# Patient Record
Sex: Female | Born: 1965 | Race: Black or African American | Hispanic: No | Smoking: Never smoker
Health system: Southern US, Community
[De-identification: ages and names within clinical notes are randomized; demographics above are authoritative.]

## PROBLEM LIST (undated history)

## (undated) DIAGNOSIS — F419 Anxiety disorder, unspecified: Secondary | ICD-10-CM

## (undated) DIAGNOSIS — D649 Anemia, unspecified: Secondary | ICD-10-CM

## (undated) DIAGNOSIS — K219 Gastro-esophageal reflux disease without esophagitis: Secondary | ICD-10-CM

## (undated) DIAGNOSIS — Z5189 Encounter for other specified aftercare: Secondary | ICD-10-CM

## (undated) DIAGNOSIS — D219 Benign neoplasm of connective and other soft tissue, unspecified: Secondary | ICD-10-CM

## (undated) DIAGNOSIS — J45909 Unspecified asthma, uncomplicated: Secondary | ICD-10-CM

## (undated) HISTORY — PX: EMBOLIZATION: SHX1496

## (undated) HISTORY — PX: CHOLECYSTECTOMY: SHX55

## (undated) HISTORY — DX: Encounter for other specified aftercare: Z51.89

## (undated) HISTORY — PX: MYOMECTOMY: SHX85

## (undated) HISTORY — PX: THYROIDECTOMY, PARTIAL: SHX18

## (undated) HISTORY — PX: OTHER SURGICAL HISTORY: SHX169

---

## 2010-06-09 ENCOUNTER — Emergency Department (HOSPITAL_COMMUNITY): Admission: EM | Admit: 2010-06-09 | Discharge: 2010-06-09 | Payer: Self-pay | Admitting: Emergency Medicine

## 2016-01-15 ENCOUNTER — Other Ambulatory Visit: Payer: Self-pay | Admitting: Obstetrics and Gynecology

## 2016-01-25 NOTE — Patient Instructions (Addendum)
Your procedure is scheduled on:  Monday, February 01, 2016  Enter through the Main Entrance of [facility] at:  12:45 PM  Pick up the phone at the desk and dial [phone].  Call this number if you have problems the morning of surgery: [phone].  Remember:  Do NOT eat food:  After midnight Sunday  Do NOT drink clear liquids after:  9:00 AM day of surgery  Take these medicines the morning of surgery with a SIP OF WATER:  Clonazepam  Bring asthma inhaler day of surgery  Do NOT wear jewelry (body piercing), metal hair clips/bobby pins, make-up, or nail polish. Do NOT wear lotions, powders, or perfumes.  You may wear deoderant. Do NOT shave for 48 hours prior to surgery. Do NOT bring valuables to the hospital. Contacts, dentures, or bridgework may not be worn into surgery.  Leave suitcase in car.  After surgery it may be brought to your room.  For patients admitted to the hospital, checkout time is 11:00 AM the day of discharge.

## 2016-01-26 ENCOUNTER — Encounter (HOSPITAL_COMMUNITY)
Admission: RE | Admit: 2016-01-26 | Discharge: 2016-01-26 | Disposition: A | Discharge status: Home / Self Care | Payer: BC Managed Care – PPO | Source: Ambulatory Visit | Attending: Obstetrics and Gynecology | Admitting: Obstetrics and Gynecology

## 2016-01-26 ENCOUNTER — Encounter (HOSPITAL_COMMUNITY): Payer: Self-pay

## 2016-01-26 DIAGNOSIS — Z01812 Encounter for preprocedural laboratory examination: Secondary | ICD-10-CM | POA: Diagnosis present

## 2016-01-26 HISTORY — DX: Anxiety disorder, unspecified: F41.9

## 2016-01-26 HISTORY — DX: Benign neoplasm of connective and other soft tissue, unspecified: D21.9

## 2016-01-26 HISTORY — DX: Anemia, unspecified: D64.9

## 2016-01-26 HISTORY — DX: Unspecified asthma, uncomplicated: J45.909

## 2016-01-26 HISTORY — DX: Gastro-esophageal reflux disease without esophagitis: K21.9

## 2016-01-26 LAB — CBC
HEMATOCRIT: 33.2 % — AB (ref 36.0–46.0)
HEMOGLOBIN: 10.4 g/dL — AB (ref 12.0–15.0)
MCH: 24.4 pg — AB (ref 26.0–34.0)
MCHC: 31.3 g/dL (ref 30.0–36.0)
MCV: 77.8 fL — AB (ref 78.0–100.0)
Platelets: 365 10*3/uL (ref 150–400)
RBC: 4.27 MIL/uL (ref 3.87–5.11)
RDW: 15.9 % — ABNORMAL HIGH (ref 11.5–15.5)
WBC: 9.1 10*3/uL (ref 4.0–10.5)

## 2016-01-26 LAB — BASIC METABOLIC PANEL
ANION GAP: 6 (ref 5–15)
BUN: 10 mg/dL (ref 6–20)
CO2: 25 mmol/L (ref 22–32)
Calcium: 9.1 mg/dL (ref 8.9–10.3)
Chloride: 106 mmol/L (ref 101–111)
Creatinine, Ser: 0.76 mg/dL (ref 0.44–1.00)
GFR calc Af Amer: >60 mL/min (ref >60)
GFR calc non Af Amer: >60 mL/min (ref >60)
GLUCOSE: 89 mg/dL (ref 65–99)
POTASSIUM: 4.5 mmol/L (ref 3.5–5.1)
Sodium: 137 mmol/L (ref 135–145)

## 2016-01-27 ENCOUNTER — Other Ambulatory Visit (HOSPITAL_COMMUNITY): Payer: Self-pay | Admitting: Obstetrics and Gynecology

## 2016-01-27 NOTE — H&P (Signed)
Marilyn Jensen is a 50 y.o.  female P: 1-0-1-1 who presents for hysterectomy because of menorrhagia, history of severe anemia and symptomatic uterine fibroids.  Since age 28 the patient has suffered heavy periods due to uterine fibroids and over time has had  Lupron injections, a myomectomy and uterine artery embolization with only  transient improvement in her symptoms.  She has undergone blood transfusions and iron injections as a result of blood loss associated with her periods.  Currently has a 7 day monthly flow with pad change every 3 hours with minimal cramping. She denies intermenstrual bleeding nor changes in bowel or bladder function .  Her constipation problems are responsive to Benefiber daily.   An endometrial biopsy in January 2017 returned benign endometrial type polyp with no hyperplasia or malignancy.  A pelvic ultrasound at that same time showed a uterus: 12.11 x 7.56 x 6.91 cm, endometrium-1.68 mm; normal ovaries and #7 uterine fibroids: 5.16 cm, 2.40 cm, 4.10 cm, 3.81 cm, 3.39 cm, 2.14 cm and 2.95 cm.   On exam the patient's uterus measures 16 week size.  A review of both medical and surgical management options were given to the patient however,  she would like definitive therapy in the form of hysterectomy.   Past Medical History  OB History: G: 2  P: 1-0-1-1;  C-section: 1997  GYN History: menarche: 50 YO    LMP: 01/11/16   Contracepton abstinence  The patient denies history of sexually transmitted disease.  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 albuterol (PROVENTIL HFA;VENTOLIN HFA) 108 (90 Base) MCG/ACT inhaler Inhale 1 puff into the lungs every 6 (six) hours as needed for wheezing or shortness of breath.  . calcium carbonate (TUMS - DOSED IN MG ELEMENTAL CALCIUM) 500 MG chewable tablet Chew 1 tablet by mouth daily as needed for indigestion or heartburn.  . clonazePAM (KLONOPIN) 0.5 MG tablet Take 0.5 mg by mouth 2 (two) times daily as needed for anxiety.  Marland Kitchen ibuprofen (ADVIL,MOTRIN) 200 MG tablet Take 200 mg by mouth every 6 (six) hours as needed (pain or headache).   No facility-administered encounter medications on file as of 01/27/2016.   Ferrous Sulfate 325 mg   bid  No Known Allergies   Denies sensitivity to peanuts, shellfish, soy, latex or adhesives.   ROS: Admits to glasses, shortness of breath with asthma flare and occasional stress urinary incontinence symptoms; Denies headache, vision changes, nasal congestion, dysphagia, tinnitus, dizziness, hoarseness, cough,  chest pain,  nausea, vomiting, diarrhea,constipation,  urinary frequency, urgency  dysuria, hematuria, vaginitis symptoms, pelvic pain, swelling of joints,easy bruising,  myalgias, arthralgias, skin rashes, unexplained weight loss and except as is mentioned in the history of present illness, patient's review of systems is otherwise negative.   Physical Exam  Bp:  104/66    P: 84   R: 20  Temperature: 98.3 degrees F orally  Weight:  205.5 lbs.  Height: 5\' 9"   BMI: 30.3  Neck: supple without masses or thyromegaly Lungs: clear to auscultation Heart: regular rate and rhythm Abdomen: firm mass from pelvis to approximately 4 fingerbreadths above the symphysis pubis Pelvic:EGBUS- wnl; vagina-normal rugae; uterus-16 weeks size, cervix without lesions or  motion tenderness; adnexae-no tenderness or masses Extremities:  no clubbing, cyanosis or edema   Assesment: Symptomatic Uterine Fibroids            Menorrhagia    Disposition:  Reviewed with the patient the indications for her procedure(s) along with  the risks of surgery to include, but not limited to: reaction to anesthesia, damage to adjacent organs, infection, excessive bleeding and the possible need for an open abdominal incision. The patient verbalized understanding of these risks and has consented to proceed with an Laparoscopically Assisted Vaginal Hysterectomy with Bilateral Salpingectomy  at [facility] on 02/01/2016 at 1: 15 p..m.   CSN# [id number]   Demmi Sindt J. Florene Glen, PA-C  for Dr. Franklyn Lor. Dillard

## 2016-01-31 MED ORDER — CEFAZOLIN SODIUM-DEXTROSE 2-3 GM-% IV SOLR
2.0000 g | INTRAVENOUS | Status: AC
  Administered 2016-02-01: 2 g via INTRAVENOUS

## 2016-02-01 ENCOUNTER — Inpatient Hospital Stay (HOSPITAL_COMMUNITY)
Admission: AD | Admit: 2016-02-01 | Discharge: 2016-02-03 | DRG: 743 | Disposition: A | Discharge status: Home / Self Care | Payer: BC Managed Care – PPO | Source: Ambulatory Visit | Attending: Obstetrics and Gynecology | Admitting: Obstetrics and Gynecology

## 2016-02-01 ENCOUNTER — Encounter (HOSPITAL_COMMUNITY)
Admission: AD | Disposition: A | Discharge status: Home / Self Care | Payer: Self-pay | Source: Ambulatory Visit | Attending: Obstetrics and Gynecology

## 2016-02-01 ENCOUNTER — Encounter (HOSPITAL_COMMUNITY): Payer: Self-pay

## 2016-02-01 ENCOUNTER — Ambulatory Visit (HOSPITAL_COMMUNITY): Payer: BC Managed Care – PPO | Admitting: Anesthesiology

## 2016-02-01 DIAGNOSIS — D649 Anemia, unspecified: Secondary | ICD-10-CM | POA: Diagnosis not present

## 2016-02-01 DIAGNOSIS — N946 Dysmenorrhea, unspecified: Secondary | ICD-10-CM | POA: Diagnosis present

## 2016-02-01 DIAGNOSIS — N920 Excessive and frequent menstruation with regular cycle: Secondary | ICD-10-CM | POA: Diagnosis present

## 2016-02-01 DIAGNOSIS — J45909 Unspecified asthma, uncomplicated: Secondary | ICD-10-CM | POA: Diagnosis present

## 2016-02-01 DIAGNOSIS — D259 Leiomyoma of uterus, unspecified: Secondary | ICD-10-CM | POA: Diagnosis present

## 2016-02-01 DIAGNOSIS — J449 Chronic obstructive pulmonary disease, unspecified: Secondary | ICD-10-CM | POA: Diagnosis present

## 2016-02-01 DIAGNOSIS — D252 Subserosal leiomyoma of uterus: Secondary | ICD-10-CM | POA: Diagnosis present

## 2016-02-01 DIAGNOSIS — D251 Intramural leiomyoma of uterus: Principal | ICD-10-CM | POA: Diagnosis present

## 2016-02-01 DIAGNOSIS — K219 Gastro-esophageal reflux disease without esophagitis: Secondary | ICD-10-CM | POA: Diagnosis present

## 2016-02-01 HISTORY — PX: CYSTO: SHX6284

## 2016-02-01 HISTORY — PX: LAPAROSCOPY: SHX197

## 2016-02-01 HISTORY — PX: ABDOMINAL HYSTERECTOMY: SHX81

## 2016-02-01 LAB — ABO/RH: ABO/RH(D): A POS

## 2016-02-01 LAB — PREGNANCY, URINE: Preg Test, Ur: NEGATIVE

## 2016-02-01 LAB — PREPARE RBC (CROSSMATCH)

## 2016-02-01 SURGERY — HYSTERECTOMY, ABDOMINAL
Anesthesia: General | Site: Urethra | Laterality: Bilateral

## 2016-02-01 MED ORDER — ESTRADIOL 0.1 MG/GM VA CREA
TOPICAL_CREAM | VAGINAL | Status: AC
  Filled 2016-02-01: qty 42.5

## 2016-02-01 MED ORDER — BUPIVACAINE HCL (PF) 0.25 % IJ SOLN
INTRAMUSCULAR | Status: AC
  Filled 2016-02-01: qty 30

## 2016-02-01 MED ORDER — METHYLENE BLUE 1 % INJ SOLN
INTRAMUSCULAR | Status: AC
  Filled 2016-02-01: qty 10

## 2016-02-01 MED ORDER — SODIUM CHLORIDE 0.9 % IJ SOLN
INTRAMUSCULAR | Status: AC
  Filled 2016-02-01: qty 100

## 2016-02-01 MED ORDER — SCOPOLAMINE 1 MG/3DAYS TD PT72
MEDICATED_PATCH | TRANSDERMAL | Status: AC
  Administered 2016-02-01: 1.5 mg via TRANSDERMAL
  Filled 2016-02-01: qty 1

## 2016-02-01 MED ORDER — HEPARIN SODIUM (PORCINE) 5000 UNIT/ML IJ SOLN
INTRAMUSCULAR | Status: AC
  Filled 2016-02-01: qty 1

## 2016-02-01 MED ORDER — HYDROMORPHONE HCL 1 MG/ML IJ SOLN
INTRAMUSCULAR | Status: AC
  Filled 2016-02-01: qty 1

## 2016-02-01 MED ORDER — STERILE WATER FOR IRRIGATION IR SOLN
Status: DC | PRN
  Administered 2016-02-01: 1000 mL

## 2016-02-01 MED ORDER — ONDANSETRON HCL 4 MG/2ML IJ SOLN: 4.0000 mg | Freq: Four times a day (QID) | INTRAMUSCULAR | Status: DC | PRN

## 2016-02-01 MED ORDER — ONDANSETRON HCL 4 MG PO TABS: 4.0000 mg | ORAL_TABLET | Freq: Three times a day (TID) | ORAL | Status: DC | PRN

## 2016-02-01 MED ORDER — DIPHENHYDRAMINE HCL 50 MG/ML IJ SOLN: 12.5000 mg | Freq: Four times a day (QID) | INTRAMUSCULAR | Status: DC | PRN

## 2016-02-01 MED ORDER — MIDAZOLAM HCL 2 MG/2ML IJ SOLN: 0.5000 mg | Freq: Once | INTRAMUSCULAR | Status: DC | PRN

## 2016-02-01 MED ORDER — HYDROMORPHONE HCL 1 MG/ML IJ SOLN
INTRAMUSCULAR | Status: DC | PRN
  Administered 2016-02-01 (×2): 1 mg via INTRAVENOUS

## 2016-02-01 MED ORDER — PROPOFOL 10 MG/ML IV BOLUS
INTRAVENOUS | Status: AC
  Filled 2016-02-01: qty 20

## 2016-02-01 MED ORDER — VASOPRESSIN 20 UNIT/ML IV SOLN
INTRAVENOUS | Status: AC
  Filled 2016-02-01: qty 1

## 2016-02-01 MED ORDER — FENTANYL CITRATE (PF) 100 MCG/2ML IJ SOLN
INTRAMUSCULAR | Status: AC
  Administered 2016-02-01: 25 ug via INTRAVENOUS
  Filled 2016-02-01: qty 2

## 2016-02-01 MED ORDER — PROMETHAZINE HCL 25 MG/ML IJ SOLN: 6.2500 mg | INTRAMUSCULAR | Status: DC | PRN

## 2016-02-01 MED ORDER — MIDAZOLAM HCL 5 MG/5ML IJ SOLN
INTRAMUSCULAR | Status: DC | PRN
  Administered 2016-02-01: 2 mg via INTRAVENOUS

## 2016-02-01 MED ORDER — MEPERIDINE HCL 25 MG/ML IJ SOLN: 6.2500 mg | INTRAMUSCULAR | Status: DC | PRN

## 2016-02-01 MED ORDER — OXYCODONE-ACETAMINOPHEN 5-325 MG PO TABS
1.0000 | ORAL_TABLET | ORAL | Status: DC | PRN
  Administered 2016-02-02 – 2016-02-03 (×3): 1 via ORAL
  Filled 2016-02-01 (×4): qty 1

## 2016-02-01 MED ORDER — PROPOFOL 10 MG/ML IV BOLUS
INTRAVENOUS | Status: DC | PRN
  Administered 2016-02-01: 200 mg via INTRAVENOUS

## 2016-02-01 MED ORDER — LIDOCAINE HCL (CARDIAC) 20 MG/ML IV SOLN
INTRAVENOUS | Status: AC
  Filled 2016-02-01: qty 5

## 2016-02-01 MED ORDER — ONDANSETRON HCL 4 MG/2ML IJ SOLN
INTRAMUSCULAR | Status: AC
  Filled 2016-02-01: qty 2

## 2016-02-01 MED ORDER — SODIUM CHLORIDE 0.9% FLUSH: 9.0000 mL | INTRAVENOUS | Status: DC | PRN

## 2016-02-01 MED ORDER — IBUPROFEN 600 MG PO TABS
600.0000 mg | ORAL_TABLET | Freq: Four times a day (QID) | ORAL | Status: DC | PRN
  Administered 2016-02-02 – 2016-02-03 (×3): 600 mg via ORAL
  Filled 2016-02-01 (×4): qty 1

## 2016-02-01 MED ORDER — ONDANSETRON HCL 4 MG/2ML IJ SOLN
INTRAMUSCULAR | Status: DC | PRN
  Administered 2016-02-01: 4 mg via INTRAVENOUS

## 2016-02-01 MED ORDER — METHYLENE BLUE 0.5 % INJ SOLN
INTRAVENOUS | Status: DC | PRN
  Administered 2016-02-01: 10 mg via INTRAVENOUS

## 2016-02-01 MED ORDER — SCOPOLAMINE 1 MG/3DAYS TD PT72
1.0000 | MEDICATED_PATCH | Freq: Once | TRANSDERMAL | Status: DC
  Administered 2016-02-01: 1.5 mg via TRANSDERMAL

## 2016-02-01 MED ORDER — LACTATED RINGERS IV SOLN
INTRAVENOUS | Status: DC
  Administered 2016-02-01 – 2016-02-02 (×2): via INTRAVENOUS

## 2016-02-01 MED ORDER — ALBUTEROL SULFATE (2.5 MG/3ML) 0.083% IN NEBU: 3.0000 mL | INHALATION_SOLUTION | Freq: Four times a day (QID) | RESPIRATORY_TRACT | Status: DC | PRN

## 2016-02-01 MED ORDER — LACTATED RINGERS IV SOLN
INTRAVENOUS | Status: DC
  Administered 2016-02-01: 15:00:00 via INTRAVENOUS
  Administered 2016-02-01: 125 mL/h via INTRAVENOUS
  Administered 2016-02-01: 14:00:00 via INTRAVENOUS
  Administered 2016-02-01: 10 mL/h via INTRAVENOUS
  Administered 2016-02-01: 15:00:00 via INTRAVENOUS

## 2016-02-01 MED ORDER — NEOSTIGMINE METHYLSULFATE 10 MG/10ML IV SOLN
INTRAVENOUS | Status: DC | PRN
  Administered 2016-02-01: 3 mg via INTRAVENOUS

## 2016-02-01 MED ORDER — FENTANYL CITRATE (PF) 250 MCG/5ML IJ SOLN
INTRAMUSCULAR | Status: AC
  Filled 2016-02-01: qty 5

## 2016-02-01 MED ORDER — CEFAZOLIN SODIUM 1-5 GM-% IV SOLN
1.0000 g | Freq: Once | INTRAVENOUS | Status: AC
  Administered 2016-02-01: 1 g via INTRAVENOUS
  Filled 2016-02-01: qty 50

## 2016-02-01 MED ORDER — ROCURONIUM BROMIDE 100 MG/10ML IV SOLN
INTRAVENOUS | Status: DC | PRN
  Administered 2016-02-01 (×2): 10 mg via INTRAVENOUS
  Administered 2016-02-01: 20 mg via INTRAVENOUS
  Administered 2016-02-01: 50 mg via INTRAVENOUS
  Administered 2016-02-01: 10 mg via INTRAVENOUS

## 2016-02-01 MED ORDER — NALOXONE HCL 0.4 MG/ML IJ SOLN: 0.4000 mg | INTRAMUSCULAR | Status: DC | PRN

## 2016-02-01 MED ORDER — DEXAMETHASONE SODIUM PHOSPHATE 10 MG/ML IJ SOLN
INTRAMUSCULAR | Status: AC
  Filled 2016-02-01: qty 1

## 2016-02-01 MED ORDER — ROCURONIUM BROMIDE 100 MG/10ML IV SOLN
INTRAVENOUS | Status: AC
  Filled 2016-02-01: qty 1

## 2016-02-01 MED ORDER — FENTANYL CITRATE (PF) 100 MCG/2ML IJ SOLN
INTRAMUSCULAR | Status: DC | PRN
  Administered 2016-02-01: 50 ug via INTRAVENOUS
  Administered 2016-02-01 (×3): 100 ug via INTRAVENOUS
  Administered 2016-02-01: 150 ug via INTRAVENOUS

## 2016-02-01 MED ORDER — CEFAZOLIN SODIUM-DEXTROSE 2-3 GM-% IV SOLR
INTRAVENOUS | Status: AC
  Filled 2016-02-01: qty 50

## 2016-02-01 MED ORDER — KETOROLAC TROMETHAMINE 30 MG/ML IJ SOLN
INTRAMUSCULAR | Status: AC
  Filled 2016-02-01: qty 1

## 2016-02-01 MED ORDER — MENTHOL 3 MG MT LOZG: 1.0000 | LOZENGE | OROMUCOSAL | Status: DC | PRN

## 2016-02-01 MED ORDER — LIDOCAINE HCL (CARDIAC) 20 MG/ML IV SOLN
INTRAVENOUS | Status: DC | PRN
  Administered 2016-02-01: 100 mg via INTRAVENOUS

## 2016-02-01 MED ORDER — MIDAZOLAM HCL 2 MG/2ML IJ SOLN
INTRAMUSCULAR | Status: AC
  Filled 2016-02-01: qty 2

## 2016-02-01 MED ORDER — DIPHENHYDRAMINE HCL 12.5 MG/5ML PO ELIX: 12.5000 mg | ORAL_SOLUTION | Freq: Four times a day (QID) | ORAL | Status: DC | PRN

## 2016-02-01 MED ORDER — DEXAMETHASONE SODIUM PHOSPHATE 10 MG/ML IJ SOLN
INTRAMUSCULAR | Status: DC | PRN
  Administered 2016-02-01: 10 mg via INTRAVENOUS

## 2016-02-01 MED ORDER — BUPIVACAINE HCL (PF) 0.25 % IJ SOLN
INTRAMUSCULAR | Status: DC | PRN
  Administered 2016-02-01: 5 mL

## 2016-02-01 MED ORDER — HYDROMORPHONE 1 MG/ML IV SOLN
INTRAVENOUS | Status: DC
  Administered 2016-02-01: 0.9 mg via INTRAVENOUS
  Administered 2016-02-01: 19:00:00 via INTRAVENOUS
  Administered 2016-02-02 (×2): 1.2 mg via INTRAVENOUS
  Filled 2016-02-01: qty 25

## 2016-02-01 MED ORDER — FENTANYL CITRATE (PF) 100 MCG/2ML IJ SOLN
25.0000 ug | INTRAMUSCULAR | Status: DC | PRN
  Administered 2016-02-01 (×2): 25 ug via INTRAVENOUS

## 2016-02-01 MED ORDER — GLYCOPYRROLATE 0.2 MG/ML IJ SOLN
INTRAMUSCULAR | Status: DC | PRN
  Administered 2016-02-01: 0.6 mg via INTRAVENOUS

## 2016-02-01 SURGICAL SUPPLY — 69 items
CABLE HIGH FREQUENCY MONO STRZ (ELECTRODE) IMPLANT
CLOSURE WOUND 1/4 X3 (GAUZE/BANDAGES/DRESSINGS)
CLOTH BEACON ORANGE TIMEOUT ST (SAFETY) ×5 IMPLANT
CONT PATH 16OZ SNAP LID 3702 (MISCELLANEOUS) IMPLANT
COVER BACK TABLE 60X90IN (DRAPES) ×5 IMPLANT
COVER MAYO STAND STRL (DRAPES) ×5 IMPLANT
DECANTER SPIKE VIAL GLASS SM (MISCELLANEOUS) ×10 IMPLANT
DISSECTOR SPONGE CHERRY (GAUZE/BANDAGES/DRESSINGS) ×5 IMPLANT
DRAPE SHEET LG 3/4 BI-LAMINATE (DRAPES) ×10 IMPLANT
DRAPE WARM FLUID 44X44 (DRAPE) ×5 IMPLANT
DRSG COVADERM PLUS 2X2 (GAUZE/BANDAGES/DRESSINGS) IMPLANT
DRSG OPSITE POSTOP 3X4 (GAUZE/BANDAGES/DRESSINGS) IMPLANT
DRSG OPSITE POSTOP 4X10 (GAUZE/BANDAGES/DRESSINGS) ×5 IMPLANT
DURAPREP 26ML APPLICATOR (WOUND CARE) ×5 IMPLANT
ELECT BLADE 6.5 EXT (BLADE) ×5 IMPLANT
ELECT REM PT RETURN 9FT ADLT (ELECTROSURGICAL) ×5
ELECTRODE REM PT RTRN 9FT ADLT (ELECTROSURGICAL) ×3 IMPLANT
EVACUATOR SMOKE 8.L (FILTER) IMPLANT
FORCEPS CUTTING 33CM 5MM (CUTTING FORCEPS) IMPLANT
GAUZE PACKING 2X5 YD STRL (GAUZE/BANDAGES/DRESSINGS) IMPLANT
GAUZE VASELINE 3X9 (GAUZE/BANDAGES/DRESSINGS) IMPLANT
GLOVE BIO SURGEON STRL SZ 6.5 (GLOVE) ×8 IMPLANT
GLOVE BIO SURGEONS STRL SZ 6.5 (GLOVE) ×2
GLOVE BIOGEL PI IND STRL 6.5 (GLOVE) ×3 IMPLANT
GLOVE BIOGEL PI IND STRL 7.0 (GLOVE) ×12 IMPLANT
GLOVE BIOGEL PI INDICATOR 6.5 (GLOVE) ×2
GLOVE BIOGEL PI INDICATOR 7.0 (GLOVE) ×8
GLOVE ECLIPSE 6.5 STRL STRAW (GLOVE) ×5 IMPLANT
GLOVE INDICATOR 7.0 STRL GRN (GLOVE) ×5 IMPLANT
GLOVE SURG SS PI 7.0 STRL IVOR (GLOVE) ×15 IMPLANT
LEGGING LITHOTOMY PAIR STRL (DRAPES) ×5 IMPLANT
LIQUID BAND (GAUZE/BANDAGES/DRESSINGS) IMPLANT
NEEDLE MAYO CATGUT SZ4 (NEEDLE) ×5 IMPLANT
NS IRRIG 1000ML POUR BTL (IV SOLUTION) ×10 IMPLANT
PACK LAVH (CUSTOM PROCEDURE TRAY) ×5 IMPLANT
PACK ROBOTIC GOWN (GOWN DISPOSABLE) ×5 IMPLANT
PAD ABD 7.5X8 STRL (GAUZE/BANDAGES/DRESSINGS) ×5 IMPLANT
PAD TRENDELENBURG POSITION (MISCELLANEOUS) ×5 IMPLANT
SET CYSTO W/LG BORE CLAMP LF (SET/KITS/TRAYS/PACK) ×5 IMPLANT
SET IRRIG TUBING LAPAROSCOPIC (IRRIGATION / IRRIGATOR) ×5 IMPLANT
SHEARS HARMONIC ACE PLUS 36CM (ENDOMECHANICALS) IMPLANT
SLEEVE XCEL OPT CAN 5 100 (ENDOMECHANICALS) ×10 IMPLANT
SOLUTION ELECTROLUBE (MISCELLANEOUS) IMPLANT
SPONGE GAUZE 4X4 12PLY STER LF (GAUZE/BANDAGES/DRESSINGS) ×5 IMPLANT
SPONGE LAP 18X18 X RAY DECT (DISPOSABLE) ×15 IMPLANT
SPONGE SURGIFOAM ABS GEL 12-7 (HEMOSTASIS) ×10 IMPLANT
STRIP CLOSURE SKIN 1/4X3 (GAUZE/BANDAGES/DRESSINGS) IMPLANT
SUT CHROMIC 0 CT 1 (SUTURE) ×10 IMPLANT
SUT MNCRL AB 3-0 PS2 27 (SUTURE) ×10 IMPLANT
SUT PLAIN 2 0 XLH (SUTURE) ×5 IMPLANT
SUT VIC AB 0 CT1 18XCR BRD8 (SUTURE) ×12 IMPLANT
SUT VIC AB 0 CT1 27 (SUTURE) ×2
SUT VIC AB 0 CT1 27XBRD ANBCTR (SUTURE) ×3 IMPLANT
SUT VIC AB 0 CT1 36 (SUTURE) ×5 IMPLANT
SUT VIC AB 0 CT1 8-18 (SUTURE) ×8
SUT VIC AB 2-0 SH 27 (SUTURE) ×2
SUT VIC AB 2-0 SH 27XBRD (SUTURE) ×3 IMPLANT
SUT VIC AB 3-0 SH 27 (SUTURE) ×2
SUT VIC AB 3-0 SH 27X BRD (SUTURE) ×3 IMPLANT
SUT VICRYL 0 TIES 12 18 (SUTURE) ×5 IMPLANT
SUT VICRYL 0 UR6 27IN ABS (SUTURE) ×5 IMPLANT
SYR 50ML LL SCALE MARK (SYRINGE) IMPLANT
SYR BULB IRRIGATION 50ML (SYRINGE) ×5 IMPLANT
SYR TB 1ML LUER SLIP (SYRINGE) ×5 IMPLANT
TOWEL OR 17X24 6PK STRL BLUE (TOWEL DISPOSABLE) ×10 IMPLANT
TRAY FOLEY CATH SILVER 14FR (SET/KITS/TRAYS/PACK) ×5 IMPLANT
TROCAR BALLN 12MMX100 BLUNT (TROCAR) ×5 IMPLANT
TUBING FILTER THERMOFLATOR (ELECTROSURGICAL) IMPLANT
WATER STERILE IRR 1000ML POUR (IV SOLUTION) IMPLANT

## 2016-02-01 NOTE — Op Note (Signed)
Indications: symptomatic Fibroids Menorrhagia dysmenorrhea  Pre-operative Diagnosis: see above  Post-operative Diagnosis: same  Operation:  Diagnostic laparoscopy.  Total abdominal hysterectomy.  Bilateral salpinectomy.LOA for one hour, cystoscopy  Surgeon: [id number]   Assistants: Delsa Bern MD, Earnstine Regal PA  Anesthesia: General endotracheal anesthesia  ASA Class: per anesthesia  Procedure Details  The patient was seen in the Holding Room. The risks, benefits, complications, treatment options, and expected outcomes were discussed with the patient.  The patient concurred with the proposed plan, giving informed consent.  The site of surgery properly noted/marked. The patient was taken to Operating Room # 4, identified as Marilyn Jensen and the procedure verified as a  LAVH, poss  Total abdominal hysterectomy and cystoscopy.   A Time Out was held and the above information confirmed.  After induction of anesthesia, the patient was draped and prepped in the usual sterile manner. Pt was placed in supine position after anesthesia and draped and prepped in the usual sterile manner. Foley catheter was placed. Attention was then turned to the vagina.  The bilvalve speculum was placed in the vagina.  The anterior lip of the cervix was grasped with a single tooth tenaculum.  The hulka was placed on the cervix.    A umbilical incision was made and carried through the subcutaneous tissue to the fascia.the peritoneum was opened.  The hasson was placed.  The abdomen was insufflated with co2 gas.  The camera was placed in the abdomen.  The uterus was 18 week size and irregular.     The uterus was about 16-18 week size and several adhesions were seen.  I decided to do an abdominal hysterectomy.   The umbilical fascial incision was closed. A pfannensteil incision was made with the knife on the skin 2 cm above the symphysis pubis.   Fascial incision was made and extended bilaterally. The rectus  muscles were separated. The peritoneum was identified and entered. Peritoneal incision was extended longitudinally.  The above findings were noted. There uterus was adherant to the anterior abdominal wall, bladder and both abdominal side walls.  LOA took one hour.  Then the uterus was free enough to start the hysterectomy.   A belfour retractor with the extendor was placed and bowel was packed away from the surgical site.   The round ligaments were identified, cut, and ligated with 0-Vicryl. The anterior peritoneal reflection was incised and the bladder was dissected off the lower uterine segment. . The right utero-ovarian ligament and proximal fallopian tube were grasped, cut, and suture ligated with 0-Vicryl. The left utero-ovarian ligament and proximal fallopian tube were grasped, cut and suture ligated with 0-Vicryl. Hemostasis was observed. The fallopian tubes were clamped with kelly clamps and removed using metzenbaum scissors.  The mesosalpinx was made hemostatic with 0 vicryl suture.   The uterine vessels were skeletonized, then clamped, cut and suture ligated with 0-Vicryl suture. The fundus was removed using the knife to obtain better visualization.  The right parametrium was bleeding and made hemostatic with a figure of eight suture.    Serial pedicles of the cardinal and utero-sacral ligaments were clamped, cut, and suture ligated with 0-Vicryl. Entrance was made into the vagina and the uterus removed. Vaginal cuff angle sutures were placed incorporating the utero-sacral ligaments for support. The vaginal cuff was then closed with a running stitch of 0- Vicryl. Lavage was carried out until clear. Hemostasis was observed. There was some oozing from the vaginal cuff which was made hemostatic with figure of  8 o vicryl suture.  surgicel was placed along the cuff as well.    Retractor and all packing was removed from the abdomen. The peritoneum was closed with 0 chromic.   The fascia was approximated  with running sutures of 0-Vicryl. Lavage was again carried out.  Hemostasis was observed. The subcutaneous tissue was reapproximated with o plain in interrupted suture.   The umbilical and pfannesteil skin incisions  Were approximated 4-0 monocryl with subcuticular stitches.  Cystoscopy was done.  Both ureters were seen to efflux urine. The bladder was intact.    Instrument, sponge, and needle counts were correct prior to abdominal closure and at the conclusion of the case.   Findings: 18 week size fibroid uterus.  Irregular and bulky.  Normal appearing adnexa B  Estimated Blood Loss:  700 mL         Drains: none         Total IV Fluids: 3053ml         Specimens: uterus , cervix and bilateral tubes         Implants: none         Complications:  None; patient tolerated the procedure well.         Disposition: PACU - hemodynamically stable.         Condition: stable  Attending Attestation: I was present and scrubbed for the entire procedure.

## 2016-02-01 NOTE — Anesthesia Preprocedure Evaluation (Addendum)
Anesthesia Evaluation  Patient identified by MRN, date of birth, ID band Patient awake    Reviewed: Allergy & Precautions, NPO status , Patient's Chart, lab work & pertinent test results  History of Anesthesia Complications Negative for: history of anesthetic complications  Airway Mallampati: II  TM Distance: >3 FB Neck ROM: Full    Dental  (+) Dental Advisory Given   Pulmonary asthma , COPD,  COPD inhaler,    breath sounds clear to auscultation       Cardiovascular negative cardio ROS   Rhythm:Regular Rate:Normal     Neuro/Psych Anxiety negative neurological ROS     GI/Hepatic Neg liver ROS, GERD  Controlled,  Endo/Other  negative endocrine ROS  Renal/GU negative Renal ROS     Musculoskeletal   Abdominal (+) + obese,   Peds  Hematology  (+) Blood dyscrasia (Hb 10.4), ,   Anesthesia Other Findings   Reproductive/Obstetrics                            Anesthesia Physical Anesthesia Plan  ASA: II  Anesthesia Plan: General   Post-op Pain Management:    Induction: Intravenous  Airway Management Planned: Oral ETT  Additional Equipment:   Intra-op Plan:   Post-operative Plan: Extubation in OR  Informed Consent: I have reviewed the patients History and Physical, chart, labs and discussed the procedure including the risks, benefits and alternatives for the proposed anesthesia with the patient or authorized representative who has indicated his/her understanding and acceptance.   Dental advisory given  Plan Discussed with: CRNA and Surgeon  Anesthesia Plan Comments: (Plan routine monitors, GETA)        Anesthesia Quick Evaluation

## 2016-02-01 NOTE — Transfer of Care (Signed)
Immediate Anesthesia Transfer of Care Note  Patient: Marilyn Jensen  Procedure(s) Performed: Procedure(s): HYSTERECTOMY ABDOMINAL, Bilateral Salpingectomy, Lysis of Adhesions LAPAROSCOPY DIAGNOSTIC CYSTO  Patient Location: PACU  Anesthesia Type:General  Level of Consciousness: sedated  Airway & Oxygen Therapy: Patient Spontanous Breathing and Patient connected to nasal cannula oxygen  Post-op Assessment: Report given to RN and Post -op Vital signs reviewed and stable  Post vital signs: stable  Last Vitals:  Filed Vitals:   02/01/16 1204 02/01/16 1708  BP: 156/87 135/84  Pulse: 66 83  Temp: 36.9 C 36.3 C  Resp: 16 16    Complications: No apparent anesthesia complications

## 2016-02-01 NOTE — Anesthesia Postprocedure Evaluation (Signed)
Anesthesia Post Note  Patient: Marilyn Jensen  Procedure(s) Performed: Procedure(s): HYSTERECTOMY ABDOMINAL, Bilateral Salpingectomy, Lysis of Adhesions LAPAROSCOPY DIAGNOSTIC CYSTO  Patient location during evaluation: PACU Anesthesia Type: General Level of consciousness: sedated Pain management: satisfactory to patient Vital Signs Assessment: post-procedure vital signs reviewed and stable Respiratory status: spontaneous breathing Cardiovascular status: stable Anesthetic complications: no    Last Vitals:  Filed Vitals:   02/01/16 1800 02/01/16 1806  BP: 135/75   Pulse: 86 80  Temp:    Resp: 18 13    Last Pain:  Filed Vitals:   02/01/16 1808  PainSc: [place]

## 2016-02-01 NOTE — Anesthesia Procedure Notes (Signed)
Procedure Name: Intubation Date/Time: 02/01/2016 1:24 PM Performed by: Riki Sheer Pre-anesthesia Checklist: Patient identified, Emergency Drugs available, Suction available, Patient being monitored and Timeout performed Patient Re-evaluated:Patient Re-evaluated prior to inductionOxygen Delivery Method: Circle system utilized Preoxygenation: Pre-oxygenation with 100% oxygen Intubation Type: IV induction Ventilation: Mask ventilation without difficulty Laryngoscope Size: Miller and 2 Grade View: Grade I Tube type: Oral Tube size: 7.0 mm Number of attempts: 1 Airway Equipment and Method: Stylet Placement Confirmation: ETT inserted through vocal cords under direct vision,  positive ETCO2,  CO2 detector and breath sounds checked- equal and bilateral Secured at: 21 cm Tube secured with: Tape Dental Injury: Teeth and Oropharynx as per pre-operative assessment

## 2016-02-02 ENCOUNTER — Encounter (HOSPITAL_COMMUNITY): Payer: Self-pay | Admitting: Obstetrics and Gynecology

## 2016-02-02 LAB — CBC
HCT: 28 % — ABNORMAL LOW (ref 36.0–46.0)
Hemoglobin: 8.7 g/dL — ABNORMAL LOW (ref 12.0–15.0)
MCH: 24.2 pg — AB (ref 26.0–34.0)
MCHC: 31.1 g/dL (ref 30.0–36.0)
MCV: 77.8 fL — ABNORMAL LOW (ref 78.0–100.0)
PLATELETS: 300 10*3/uL (ref 150–400)
RBC: 3.6 MIL/uL — ABNORMAL LOW (ref 3.87–5.11)
RDW: 15.6 % — ABNORMAL HIGH (ref 11.5–15.5)
WBC: 12.3 10*3/uL — ABNORMAL HIGH (ref 4.0–10.5)

## 2016-02-02 MED ORDER — METOCLOPRAMIDE HCL 10 MG PO TABS
10.0000 mg | ORAL_TABLET | Freq: Three times a day (TID) | ORAL | Status: DC
  Administered 2016-02-02 – 2016-02-03 (×4): 10 mg via ORAL
  Filled 2016-02-02 (×4): qty 1

## 2016-02-02 NOTE — Anesthesia Postprocedure Evaluation (Signed)
Anesthesia Post Note  Patient: Marilyn Jensen  Procedure(s) Performed: Procedure(s): HYSTERECTOMY ABDOMINAL, Bilateral Salpingectomy, Lysis of Adhesions LAPAROSCOPY DIAGNOSTIC CYSTO  Patient location during evaluation: [facility] Anesthesia Type: General Level of consciousness: awake, awake and alert, oriented and patient cooperative Pain management: pain level controlled Vital Signs Assessment: post-procedure vital signs reviewed and stable Respiratory status: spontaneous breathing, nonlabored ventilation, respiratory function stable and patient connected to nasal cannula oxygen Cardiovascular status: stable Postop Assessment: no signs of nausea or vomiting Anesthetic complications: no    Last Vitals:  Filed Vitals:   02/02/16 0550 02/02/16 0603  BP:  111/63  Pulse:  60  Temp:  37.2 C  Resp: 17 18    Last Pain:  Filed Vitals:   02/02/16 0607  PainSc: 3                  Caryl Fate L

## 2016-02-02 NOTE — Progress Notes (Signed)
Marilyn Jensen is a79 y.o.  [id number]  Post Op Date # 1:  Diagnostic Laparoscopy/Total Abdominal Hysterectomy/Lysis of Adhesions/Bilateral Salpingectomy/Cystoscopy  Subjective: Patient is Doing well postoperatively. Patient has Pain is controlled with current analgesics. Medications being used: narcotic analgesics including Hydromorphone PCA.Marland Kitchen Ambulating in the halls without dizziness.   Brief episodes of nausea with no vomiting.   Has voided without difficulty and tolerating liquids.  Objective: Vital signs in last 24 hours: Temp:  [97.3 F (36.3 C)-99.5 F (37.5 C)] 98.9 F (37.2 C) (03/14 0603) Pulse Rate:  [60-88] 60 (03/14 0603) Resp:  [10-18] 18 (03/14 0603) BP: (111-156)/(63-92) 111/63 mmHg (03/14 0603) SpO2:  [98 %-100 %] 100 % (03/14 0603) FiO2 (%):  [65 %-77 %] 65 % (03/14 0550)  Intake/Output from previous day: 03/13 0701 - 03/14 0700 In: 4700 [P.O.:600; I.V.:4050] Out: 4025 [id number] Intake/Output this shift: Total I/O In: 480 [P.O.:480] Out: 400 [Urine:400]  Recent Labs Lab 01/26/16 1445 02/02/16 0534  WBC 9.1 12.3*  HGB 10.4* 8.7*  HCT 33.2* 28.0*  PLT 365 300     Recent Labs Lab 01/26/16 1445  NA 137  K 4.5  CL 106  CO2 25  BUN 10  CREATININE 0.76  CALCIUM 9.1  GLUCOSE 89    EXAM: General: alert, cooperative and no distress Resp: clear to auscultation bilaterally Cardio: regular rate and rhythm, S1, S2 normal, no murmur, click, rub or gallop GI: Bowel sounds present;   dressings are clean/dry/intact Extremities: Homans sign is negative, no sign of DVT and no calf tenderness   Assessment: s/p Procedure(s): HYSTERECTOMY ABDOMINAL, Bilateral Salpingectomy, Lysis of Adhesions LAPAROSCOPY DIAGNOSTIC CYSTO: stable, progressing well and anemia  Plan: Advance diet Advance to PO medication Per Dr. Charlesetta Garibaldi, Reglan 10 mg  qid  Routine care  LOS: 1 day    Adri Schloss, PA-C 02/02/2016 8:21 AM

## 2016-02-02 NOTE — Addendum Note (Signed)
Addendum  created 02/02/16 0746 by Raenette Rover, CRNA   Modules edited: Clinical Notes   Clinical Notes:  File: [id number]

## 2016-02-03 MED ORDER — OXYCODONE-ACETAMINOPHEN 5-325 MG PO TABS: 1.0000 | ORAL_TABLET | Freq: Four times a day (QID) | ORAL | Status: DC | PRN

## 2016-02-03 MED ORDER — IBUPROFEN 600 MG PO TABS: ORAL_TABLET | ORAL | Status: DC

## 2016-02-03 NOTE — Progress Notes (Signed)
Pt verbalizes understanding of d/c instructions, medications, follow up appts, when to seek medical attention, and belongings policy. Pt was encouraged to check her room thoroughly for her belongings. Pt was given a copy of d/c instructions, prescriptions, "Recovering from Surgery" booklet. Pt has no questions at time of d/c. Pt states that her mother will be staying with her during her recovery. Pts IV was d/c by NT without complications. Pts family member is here and will be driving her home. Pt was escorted to the main entrance accompanied by NT. Marilyn Jensen

## 2016-02-03 NOTE — Progress Notes (Signed)
Marilyn Jensen is a4 y.o.  [id number]  Post Op Date # 2: Diagnostic Laparoscopy/Abdominal Hysterectomy/Lysis of Adhesions/Bilateral Salpingectomy and Cystoscopy  Subjective: Patient is Doing well postoperatively. Patient has The patient is not having any pain. Patient is controlling pain with oral Percocet and Ibuprofen. Tolerating  a regular diet without nausea or vomiting.  Ambulating in the halls without dizziness and voiding without difficulty.  Objective: Vital signs in last 24 hours: Temp:  [98.3 F (36.8 C)-99.1 F (37.3 C)] 98.4 F (36.9 C) (03/15 0531) Pulse Rate:  [64-73] 67 (03/15 0531) Resp:  [16-18] 16 (03/15 0531) BP: (92-112)/(42-67) 92/54 mmHg (03/15 0531) SpO2:  [97 %-100 %] 97 % (03/15 0531) Weight:  [203 lb (92.08 kg)] 203 lb (92.08 kg) (03/14 2223)  Intake/Output from previous day: 03/14 0701 - 03/15 0700 In: 1298 [P.O.:480; I.V.:818] Out: 800 [Urine:800] Intake/Output this shift:    Recent Labs Lab 02/02/16 0534  WBC 12.3*  HGB 8.7*  HCT 28.0*  PLT 300    No results for input(s): NA, K, CL, CO2, BUN, CREATININE, CALCIUM, PROT, BILITOT, ALKPHOS, ALT, AST, GLUCOSE in the last 168 hours.  Invalid input(s): LABALBU  EXAM: General: alert, cooperative and no distress Resp: clear to auscultation bilaterally Cardio: regular rate and rhythm, S1, S2 normal, no murmur, click, rub or gallop GI: soft, non-tender; bowel sounds normal; no masses,  no organomegaly Extremities: Homans sign is negative, no sign of DVT and no calf tenderness.   Assessment: s/p Procedure(s): HYSTERECTOMY ABDOMINAL, Bilateral Salpingectomy, Lysis of Adhesions LAPAROSCOPY DIAGNOSTIC CYSTO: stable, progressing well and anemia  Plan: Discharge home  LOS: 2 days    Rylen Hou, PA-C 02/03/2016 6:51 AM

## 2016-02-03 NOTE — Progress Notes (Signed)
2 Days Post-Op Procedure(s): HYSTERECTOMY ABDOMINAL, Bilateral Salpingectomy, Lysis of Adhesions LAPAROSCOPY DIAGNOSTIC CYSTO  Subjective: Patient reports tolerating PO and no problems voiding.    Objective: I have reviewed patient's vital signs and intake and output.  General: alert and cooperative Resp: clear to auscultation bilaterally Cardio: regular rate and rhythm GI: soft, non-tender; bowel sounds normal; no masses,  no organomegaly Extremities: extremities normal, atraumatic, no cyanosis or edema Vaginal Bleeding: minimal  Assessment: s/p Procedure(s) with comments: HYSTERECTOMY ABDOMINAL, Bilateral Salpingectomy, Lysis of Adhesions - Procedure #2 LAPAROSCOPY DIAGNOSTIC - Procedure #1 CYSTO: stable and tolerating diet  Plan: Discharge home  LOS: 2 days    [place] A 02/03/2016, 8:31 AM

## 2016-02-03 NOTE — Discharge Summary (Signed)
Physician Discharge Summary  Patient ID: Varshini Kieu MRN: [id number] DOB/AGE: 1966/01/27 50 y.o.  Admit date: 02/01/2016 Discharge date: 02/03/2016   Discharge Diagnoses:  Symptomatic Uterine Fibroids, Dense Pelvic Adhesions, Anemia and Menorrhagia Active Problems:   Menorrhagia   Operation: Diagnostic Laparoscopy/Abdominal Hysterectomy/Lysis of Adhesions/Bilateral Salpingectomy and Cystoscopy   Discharged Condition: Good  Hospital Course: On the date of admission the patient underwent the aforementioned procedures and tolerated them well.  Post operative course was unremarkable with the patient resuming bowel and bladder function and tolerating a hemoglobin of 8.7  (pre-op 10.4) by post operative day #2 and was therefore deemed ready for discharge home.  Disposition: Home to self care  Discharge Medications:    Medication List    TAKE these medications        albuterol 108 (90 Base) MCG/ACT inhaler  Commonly known as:  PROVENTIL HFA;VENTOLIN HFA  Inhale 1 puff into the lungs every 6 (six) hours as needed for wheezing or shortness of breath.     calcium carbonate 500 MG chewable tablet  Commonly known as:  TUMS - dosed in mg elemental calcium  Chew 1 tablet by mouth daily as needed for indigestion or heartburn.     clonazePAM 0.5 MG tablet  Commonly known as:  KLONOPIN  Take 0.5 mg by mouth 2 (two) times daily as needed for anxiety.     ibuprofen 600 MG tablet  Commonly known as:  ADVIL,MOTRIN  1  po  pc every 6 hours for 5 days then as needed for pain     oxyCODONE-acetaminophen 5-325 MG tablet  Commonly known as:  PERCOCET/ROXICET  Take 1-2 tablets by mouth every 6 (six) hours as needed for severe pain (moderate to severe pain (when tolerating fluids)).         Follow-up: Dr. Gwynneth Munson A. Dillard on March 07, 2016 at 10:15 a.m.   SignedEarnstine Regal , PA-C  02/03/2016, 6:59 AM

## 2016-02-03 NOTE — Discharge Instructions (Signed)
Call [facility] @ [phone] if:  You have a temperature greater than or equal to 100.4 degrees Farenheit orally You have pain that is not made better by the pain medication given and taken as directed You have excessive bleeding or problems urinating  Take Colace (Docusate Sodium/Stool Softener) 100 mg 2-3 times daily while taking narcotic pain medicine to avoid constipation or until bowel movements are regular. Take iron supplement of your choice,  twice a day for the next 12 weeks  You may drive after 2 weeks You may walk up steps  You may shower  You may resume a regular diet  Keep incisions clean and dry;  remove your honeycomb dressings on 02/08/2016  Do not lift over 15 pounds for 6 weeks Avoid anything in vagina for 6 weeks (or until after your post-operative visit) Hysterectomy Information  A hysterectomy is a surgery in which your uterus is removed. This surgery may be done to treat various medical problems. After the surgery, you will no longer have menstrual periods. The surgery will also make you unable to become pregnant (sterile). The fallopian tubes and ovaries can be removed (bilateral salpingo-oophorectomy) during this surgery as well.  REASONS FOR A HYSTERECTOMY  Persistent, abnormal bleeding.  Lasting (chronic) pelvic pain or infection.  The lining of the uterus (endometrium) starts growing outside the uterus (endometriosis).  The endometrium starts growing in the muscle of the uterus (adenomyosis).  The uterus falls down into the vagina (pelvic organ prolapse).  Noncancerous growths in the uterus (uterine fibroids) that cause symptoms.  Precancerous cells.  Cervical cancer or uterine cancer. TYPES OF HYSTERECTOMIES  Supracervical hysterectomy--In this type, the top part of the uterus is removed, but not the cervix.  Total hysterectomy--The uterus and cervix are removed.  Radical hysterectomy--The uterus, the cervix, and the fibrous tissue  that holds the uterus in place in the pelvis (parametrium) are removed. WAYS A HYSTERECTOMY CAN BE PERFORMED  Abdominal hysterectomy--A large surgical cut (incision) is made in the abdomen. The uterus is removed through this incision.  Vaginal hysterectomy--An incision is made in the vagina. The uterus is removed through this incision. There are no abdominal incisions.  Conventional laparoscopic hysterectomy--Three or four small incisions are made in the abdomen. A thin, lighted tube with a camera (laparoscope) is inserted into one of the incisions. Other tools are put through the other incisions. The uterus is cut into small pieces. The small pieces are removed through the incisions, or they are removed through the vagina.  Laparoscopically assisted vaginal hysterectomy (LAVH)--Three or four small incisions are made in the abdomen. Part of the surgery is performed laparoscopically and part vaginally. The uterus is removed through the vagina.  Robot-assisted laparoscopic hysterectomy--A laparoscope and other tools are inserted into 3 or 4 small incisions in the abdomen. A computer-controlled device is used to give the surgeon a 3D image and to help control the surgical instruments. This allows for more precise movements of surgical instruments. The uterus is cut into small pieces and removed through the incisions or removed through the vagina. RISKS AND COMPLICATIONS  Possible complications associated with this procedure include:  Bleeding and risk of blood transfusion. Tell your health care provider if you do not want to receive any blood products.  Blood clots in the legs or lung.  Infection.  Injury to surrounding organs.  Problems or side effects related to anesthesia.  Conversion to an abdominal hysterectomy from one of the other techniques. WHAT TO EXPECT AFTER A  HYSTERECTOMY  You will be given pain medicine.  You will need to have someone with you for the first 3-5 days after you  go home.  You will need to follow up with your surgeon in 2-4 weeks after surgery to evaluate your progress.  You may have early menopause symptoms such as hot flashes, night sweats, and insomnia.  If you had a hysterectomy for a problem that was not cancer or not a condition that could lead to cancer, then you no longer need Pap tests. However, even if you no longer need a Pap test, a regular exam is a good idea to make sure no other problems are starting.   This information is not intended to replace advice given to you by your health care provider. Make sure you discuss any questions you have with your health care provider.   Document Released: 05/03/2001 Document Revised: 08/28/2013 Document Reviewed: 07/15/2013 Elsevier Interactive Patient Education [employer].

## 2016-02-05 LAB — TYPE AND SCREEN
ABO/RH(D): A POS
Antibody Screen: NEGATIVE
UNIT DIVISION: 0
UNIT DIVISION: 0
Unit division: 0

## 2016-03-07 ENCOUNTER — Emergency Department (HOSPITAL_COMMUNITY)
Admission: EM | Admit: 2016-03-07 | Discharge: 2016-03-07 | Disposition: A | Discharge status: Home / Self Care | Payer: BC Managed Care – PPO | Attending: Emergency Medicine | Admitting: Emergency Medicine

## 2016-03-07 ENCOUNTER — Encounter (HOSPITAL_COMMUNITY): Payer: Self-pay | Admitting: Oncology

## 2016-03-07 ENCOUNTER — Emergency Department (HOSPITAL_COMMUNITY): Payer: BC Managed Care – PPO

## 2016-03-07 DIAGNOSIS — Z79899 Other long term (current) drug therapy: Secondary | ICD-10-CM | POA: Diagnosis not present

## 2016-03-07 DIAGNOSIS — Z862 Personal history of diseases of the blood and blood-forming organs and certain disorders involving the immune mechanism: Secondary | ICD-10-CM | POA: Insufficient documentation

## 2016-03-07 DIAGNOSIS — N83201 Unspecified ovarian cyst, right side: Secondary | ICD-10-CM | POA: Insufficient documentation

## 2016-03-07 DIAGNOSIS — J45909 Unspecified asthma, uncomplicated: Secondary | ICD-10-CM | POA: Insufficient documentation

## 2016-03-07 DIAGNOSIS — Z9049 Acquired absence of other specified parts of digestive tract: Secondary | ICD-10-CM | POA: Diagnosis not present

## 2016-03-07 DIAGNOSIS — Z86010 Personal history of colonic polyps: Secondary | ICD-10-CM | POA: Insufficient documentation

## 2016-03-07 DIAGNOSIS — Z90710 Acquired absence of both cervix and uterus: Secondary | ICD-10-CM | POA: Diagnosis not present

## 2016-03-07 DIAGNOSIS — F419 Anxiety disorder, unspecified: Secondary | ICD-10-CM | POA: Insufficient documentation

## 2016-03-07 DIAGNOSIS — K219 Gastro-esophageal reflux disease without esophagitis: Secondary | ICD-10-CM | POA: Insufficient documentation

## 2016-03-07 DIAGNOSIS — R109 Unspecified abdominal pain: Secondary | ICD-10-CM | POA: Diagnosis present

## 2016-03-07 DIAGNOSIS — Z3202 Encounter for pregnancy test, result negative: Secondary | ICD-10-CM | POA: Insufficient documentation

## 2016-03-07 DIAGNOSIS — R52 Pain, unspecified: Secondary | ICD-10-CM

## 2016-03-07 LAB — URINALYSIS, ROUTINE W REFLEX MICROSCOPIC
BILIRUBIN URINE: NEGATIVE
Glucose, UA: NEGATIVE mg/dL
HGB URINE DIPSTICK: NEGATIVE
KETONES UR: NEGATIVE mg/dL
Leukocytes, UA: NEGATIVE
NITRITE: NEGATIVE
PROTEIN: NEGATIVE mg/dL
Specific Gravity, Urine: 1.017 (ref 1.005–1.030)
pH: 7 (ref 5.0–8.0)

## 2016-03-07 LAB — I-STAT CHEM 8, ED
BUN: 7 mg/dL (ref 6–20)
CALCIUM ION: 1.16 mmol/L (ref 1.12–1.23)
Chloride: 105 mmol/L (ref 101–111)
Creatinine, Ser: 0.7 mg/dL (ref 0.44–1.00)
Glucose, Bld: 95 mg/dL (ref 65–99)
HCT: 35 % — ABNORMAL LOW (ref 36.0–46.0)
Hemoglobin: 11.9 g/dL — ABNORMAL LOW (ref 12.0–15.0)
Potassium: 4 mmol/L (ref 3.5–5.1)
SODIUM: 140 mmol/L (ref 135–145)
TCO2: 22 mmol/L (ref 0–100)

## 2016-03-07 LAB — CBC WITH DIFFERENTIAL/PLATELET
Basophils Absolute: 0 10*3/uL (ref 0.0–0.1)
Basophils Relative: 0 %
EOS ABS: 0.1 10*3/uL (ref 0.0–0.7)
EOS PCT: 1 %
HCT: 31 % — ABNORMAL LOW (ref 36.0–46.0)
Hemoglobin: 9.8 g/dL — ABNORMAL LOW (ref 12.0–15.0)
LYMPHS ABS: 2.5 10*3/uL (ref 0.7–4.0)
Lymphocytes Relative: 38 %
MCH: 23.6 pg — AB (ref 26.0–34.0)
MCHC: 31.6 g/dL (ref 30.0–36.0)
MCV: 74.5 fL — ABNORMAL LOW (ref 78.0–100.0)
MONO ABS: 0.5 10*3/uL (ref 0.1–1.0)
MONOS PCT: 8 %
Neutro Abs: 3.5 10*3/uL (ref 1.7–7.7)
Neutrophils Relative %: 53 %
PLATELETS: 390 10*3/uL (ref 150–400)
RBC: 4.16 MIL/uL (ref 3.87–5.11)
RDW: 15.6 % — AB (ref 11.5–15.5)
WBC: 6.5 10*3/uL (ref 4.0–10.5)

## 2016-03-07 LAB — POC URINE PREG, ED: Preg Test, Ur: NEGATIVE

## 2016-03-07 MED ORDER — NAPROXEN 375 MG PO TABS
375.0000 mg | ORAL_TABLET | Freq: Two times a day (BID) | ORAL | Status: DC
Start: 2016-03-07 — End: 2018-04-24

## 2016-03-07 MED ORDER — ONDANSETRON HCL 4 MG/2ML IJ SOLN
4.0000 mg | Freq: Once | INTRAMUSCULAR | Status: AC
  Administered 2016-03-07: 4 mg via INTRAVENOUS
  Filled 2016-03-07: qty 2

## 2016-03-07 MED ORDER — KETOROLAC TROMETHAMINE 30 MG/ML IJ SOLN
30.0000 mg | Freq: Once | INTRAMUSCULAR | Status: AC
  Administered 2016-03-07: 30 mg via INTRAVENOUS
  Filled 2016-03-07: qty 1

## 2016-03-07 MED ORDER — ACETAMINOPHEN 500 MG PO TABS
1000.0000 mg | ORAL_TABLET | Freq: Once | ORAL | Status: AC
  Administered 2016-03-07: 1000 mg via ORAL
  Filled 2016-03-07: qty 2

## 2016-03-07 NOTE — ED Notes (Signed)
Pt presents w/ right sided flank pain.  Describes pain as intermittent sharp shooting pain.  7/10.  +nausea.  +hesitancy since hysterectomy five weeks ago.

## 2016-03-07 NOTE — ED Notes (Signed)
Portable US at bedside.

## 2016-03-07 NOTE — ED Provider Notes (Signed)
CSN: [id number]     Arrival date & time 03/07/16  0027 History   By signing my name below, I, Forrestine Him, attest that this documentation has been prepared under the direction and in the presence of Keylie Beavers, MD.  Electronically Signed: Forrestine Him, ED Scribe. 03/07/2016. 2:31 AM.   Chief Complaint  Patient presents with  . Flank Pain   Patient is a 50 y.o. female presenting with flank pain. The history is provided by the patient. No language interpreter was used.  Flank Pain This is a new problem. The current episode started 3 to 5 hours ago. The problem occurs constantly. The problem has not changed since onset.Pertinent negatives include no chest pain, no headaches and no shortness of breath. Nothing aggravates the symptoms. Nothing relieves the symptoms. She has tried nothing for the symptoms. The treatment provided no relief.    HPI Comments: Marilyn Jensen is a 50 y.o. female who presents to the Emergency Department complaining of intermittent, ongoing R flank pain onset 9:00 PM this evening which came on after eating. Pain is described as shooting and rated 7/10. No aggravating or alleviating factors at this time. She also reports nausea and vomiting. 2 episodes of vomiting reported. No OTC medications or home remedies attempted prior to arrival. No recent fever, chills, nausea, vomiting, or chest pain. Pt recently underwent a hysterectomy 5 weeks ago. She is currently still on antibiotics. However, she states it has been several days since her last dose of pain medication.  PCP: Maximino Greenland, MD    Past Medical History  Diagnosis Date  . Asthma   . Anxiety   . GERD (gastroesophageal reflux disease)   . Anemia   . Fibroid    Past Surgical History  Procedure Laterality Date  . Thyroidectomy, partial    . Myomectomy    . Cholecystectomy    . Embolization      fibroids  . Cesarean section    . Under arm surgery      fatty tissue removal  . Abdominal hysterectomy   02/01/2016    Procedure: HYSTERECTOMY ABDOMINAL, Bilateral Salpingectomy, Lysis of Adhesions;  Surgeon: Crawford Givens, MD;  Location: [facility];  Service: Gynecology;;  Procedure #2  . Laparoscopy  02/01/2016    Procedure: LAPAROSCOPY DIAGNOSTIC;  Surgeon: Crawford Givens, MD;  Location: [facility];  Service: Gynecology;;  Procedure #1  . Cysto  02/01/2016    Procedure: CYSTO;  Surgeon: Crawford Givens, MD;  Location: [facility];  Service: Gynecology;;   History reviewed. No pertinent family history. Social History  Substance Use Topics  . Smoking status: Never Smoker   . Smokeless tobacco: Never Used  . Alcohol Use: Yes     Comment: occ   OB History    No data available     Review of Systems  Respiratory: Negative for shortness of breath.   Cardiovascular: Negative for chest pain.  Gastrointestinal: Positive for nausea. Negative for vomiting.  Genitourinary: Positive for flank pain.  Neurological: Negative for headaches.  All other systems reviewed and are negative.     Allergies  Review of patient's allergies indicates no known allergies.  Home Medications   Prior to Admission medications   Medication Sig Start Date End Date Taking? Authorizing Provider  albuterol (PROVENTIL HFA;VENTOLIN HFA) 108 (90 Base) MCG/ACT inhaler Inhale 1 puff into the lungs every 6 (six) hours as needed for wheezing or shortness of breath.    Historical Provider, MD  calcium carbonate (TUMS - DOSED IN  MG ELEMENTAL CALCIUM) 500 MG chewable tablet Chew 1 tablet by mouth daily as needed for indigestion or heartburn.    Historical Provider, MD  clonazePAM (KLONOPIN) 0.5 MG tablet Take 0.5 mg by mouth 2 (two) times daily as needed for anxiety.    Historical Provider, MD  ibuprofen (ADVIL,MOTRIN) 600 MG tablet 1  po  pc every 6 hours for 5 days then as needed for pain 02/03/16   Earnstine Regal, PA-C  oxyCODONE-acetaminophen (PERCOCET/ROXICET) 5-325 MG tablet Take 1-2 tablets by mouth every 6 (six) hours as needed for  severe pain (moderate to severe pain (when tolerating fluids)). 02/03/16   Earnstine Regal, PA-C   Triage Vitals: BP 148/105 mmHg  Pulse 88  Temp(Src) 97.9 F (36.6 C) (Oral)  Resp 20  SpO2 100%  LMP 01/11/2016 (Approximate)   Physical Exam  Constitutional: She is oriented to person, place, and time. She appears well-developed and well-nourished. No distress.  HENT:  Head: Normocephalic and atraumatic.  Mouth/Throat: Oropharynx is clear and moist. No oropharyngeal exudate.  Eyes: EOM are normal. Pupils are equal, round, and reactive to light.  Neck: Normal range of motion. Neck supple.  Cardiovascular: Normal rate, regular rhythm and normal heart sounds.   Pulmonary/Chest: Effort normal and breath sounds normal. She has no wheezes. She has no rales.  Abdominal: Soft. She exhibits no distension. There is no tenderness. There is no rebound and no guarding.  Hyperactive bowel sounds   Musculoskeletal: Normal range of motion.  Neurological: She is alert and oriented to person, place, and time.  Skin: Skin is warm and dry.  Psychiatric: She has a normal mood and affect. Judgment normal.  Nursing note and vitals reviewed.   ED Course  Procedures (including critical care time)  DIAGNOSTIC STUDIES: Oxygen Saturation is 100% on RA, Normal by my interpretation.    COORDINATION OF CARE: 1:33 AM- Will order blood work and urinalysis. Discussed treatment plan with pt at bedside and pt agreed to plan.     Labs Review Labs Reviewed  CBC WITH DIFFERENTIAL/PLATELET  URINALYSIS, ROUTINE W REFLEX MICROSCOPIC (NOT AT [facility])  I-STAT CHEM 8, ED  POC URINE PREG, ED    Imaging Review No results found. I have personally reviewed and evaluated these images and lab results as part of my medical decision-making.   EKG Interpretation None      MDM   Final diagnoses:  None   Filed Vitals:   03/07/16 0555 03/07/16 0600  BP: 120/99 125/77  Pulse: 73 70  Temp:    Resp: 16    Results for  orders placed or performed during the hospital encounter of 03/07/16  CBC with Differential/Platelet  Result Value Ref Range   WBC 6.5 4.0 - 10.5 K/uL   RBC 4.16 3.87 - 5.11 MIL/uL   Hemoglobin 9.8 (L) 12.0 - 15.0 g/dL   HCT 31.0 (L) 36.0 - 46.0 %   MCV 74.5 (L) 78.0 - 100.0 fL   MCH 23.6 (L) 26.0 - 34.0 pg   MCHC 31.6 30.0 - 36.0 g/dL   RDW 15.6 (H) 11.5 - 15.5 %   Platelets 390 150 - 400 K/uL   Neutrophils Relative % 53 %   Neutro Abs 3.5 1.7 - 7.7 K/uL   Lymphocytes Relative 38 %   Lymphs Abs 2.5 0.7 - 4.0 K/uL   Monocytes Relative 8 %   Monocytes Absolute 0.5 0.1 - 1.0 K/uL   Eosinophils Relative 1 %   Eosinophils Absolute 0.1 0.0 - 0.7 K/uL  Basophils Relative 0 %   Basophils Absolute 0.0 0.0 - 0.1 K/uL  I-Stat Chem 8, ED  Result Value Ref Range   Sodium 140 135 - 145 mmol/L   Potassium 4.0 3.5 - 5.1 mmol/L   Chloride 105 101 - 111 mmol/L   BUN 7 6 - 20 mg/dL   Creatinine, Ser 0.70 0.44 - 1.00 mg/dL   Glucose, Bld 95 65 - 99 mg/dL   Calcium, Ion 1.16 1.12 - 1.23 mmol/L   TCO2 22 0 - 100 mmol/L   Hemoglobin 11.9 (L) 12.0 - 15.0 g/dL   HCT 35.0 (L) 36.0 - 46.0 %   Ct Renal Stone Study  03/07/2016  CLINICAL DATA:  Right flank pain with nausea and vomiting. Hysterectomy 5 weeks ago. Patient has been seen since surgery for pelvic swelling and stitches coming undone. EXAM: CT ABDOMEN AND PELVIS WITHOUT CONTRAST TECHNIQUE: Multidetector CT imaging of the abdomen and pelvis was performed following the standard protocol without IV contrast. COMPARISON:  None. FINDINGS: Lung bases are clear. Surgical absence of the gallbladder. The unenhanced appearance of the liver, spleen, pancreas, adrenal glands, abdominal aorta, inferior vena cava, and retroperitoneal lymph nodes is unremarkable. Kidneys are symmetrical in size and shape. No hydronephrosis or hydroureter. No renal, ureteral, or bladder stones. Bladder wall is not thickened. Stomach, small bowel, and colon are not abnormally  distended. Scattered stool in the colon. No free air or free fluid in the abdomen. Pelvis: Surgical absence of the uterus. Infiltration in the low anterior pelvic wall consistent with postoperative changes at the incision site. No evidence of loculated fluid to suggest abscess. Prominent right adnexal structure measuring 4.6 cm, likely representing an enlarged ovary. Ultrasound suggested for better characterization. No free or loculated pelvic fluid collections. The appendix is normal. No destructive bone lesions. IMPRESSION: Postoperative changes in the low anterior pelvic wall. No abscess. Enlarged right ovary. Ultrasound suggested for better characterization. No renal or ureteral stone or obstruction. Electronically Signed   By: Lucienne Capers M.D.   On: 03/07/2016 05:06    Medications  ketorolac (TORADOL) 30 MG/ML injection 30 mg (30 mg Intravenous Given 03/07/16 0432)  ondansetron (ZOFRAN) injection 4 mg (4 mg Intravenous Given 03/07/16 0432)  acetaminophen (TYLENOL) tablet 1,000 mg (1,000 mg Oral Given 03/07/16 0432)    Pending pelvic US to rule out torsion   Results for orders placed or performed during the hospital encounter of 03/07/16  CBC with Differential/Platelet  Result Value Ref Range   WBC 6.5 4.0 - 10.5 K/uL   RBC 4.16 3.87 - 5.11 MIL/uL   Hemoglobin 9.8 (L) 12.0 - 15.0 g/dL   HCT 31.0 (L) 36.0 - 46.0 %   MCV 74.5 (L) 78.0 - 100.0 fL   MCH 23.6 (L) 26.0 - 34.0 pg   MCHC 31.6 30.0 - 36.0 g/dL   RDW 15.6 (H) 11.5 - 15.5 %   Platelets 390 150 - 400 K/uL   Neutrophils Relative % 53 %   Neutro Abs 3.5 1.7 - 7.7 K/uL   Lymphocytes Relative 38 %   Lymphs Abs 2.5 0.7 - 4.0 K/uL   Monocytes Relative 8 %   Monocytes Absolute 0.5 0.1 - 1.0 K/uL   Eosinophils Relative 1 %   Eosinophils Absolute 0.1 0.0 - 0.7 K/uL   Basophils Relative 0 %   Basophils Absolute 0.0 0.0 - 0.1 K/uL  Urinalysis, Routine w reflex microscopic (not at [facility])  Result Value Ref Range   Color, Urine YELLOW  YELLOW  APPearance CLEAR CLEAR   Specific Gravity, Urine 1.017 1.005 - 1.030   pH 7.0 5.0 - 8.0   Glucose, UA NEGATIVE NEGATIVE mg/dL   Hgb urine dipstick NEGATIVE NEGATIVE   Bilirubin Urine NEGATIVE NEGATIVE   Ketones, ur NEGATIVE NEGATIVE mg/dL   Protein, ur NEGATIVE NEGATIVE mg/dL   Nitrite NEGATIVE NEGATIVE   Leukocytes, UA NEGATIVE NEGATIVE  I-Stat Chem 8, ED  Result Value Ref Range   Sodium 140 135 - 145 mmol/L   Potassium 4.0 3.5 - 5.1 mmol/L   Chloride 105 101 - 111 mmol/L   BUN 7 6 - 20 mg/dL   Creatinine, Ser 0.70 0.44 - 1.00 mg/dL   Glucose, Bld 95 65 - 99 mg/dL   Calcium, Ion 1.16 1.12 - 1.23 mmol/L   TCO2 22 0 - 100 mmol/L   Hemoglobin 11.9 (L) 12.0 - 15.0 g/dL   HCT 35.0 (L) 36.0 - 46.0 %  POC Urine Pregnancy, ED (do NOT order at [facility])  Result Value Ref Range   Preg Test, Ur NEGATIVE NEGATIVE   US Transvaginal Non-ob  03/07/2016  CLINICAL DATA:  10 hours of right lower quadrant pain ; history of previous abdominal hysterectomy EXAM: TRANSABDOMINAL AND TRANSVAGINAL ULTRASOUND OF PELVIS DOPPLER ULTRASOUND OF OVARIES TECHNIQUE: Both transabdominal and transvaginal ultrasound examinations of the pelvis were performed. Transabdominal technique was performed for global imaging of the pelvis including uterus, ovaries, adnexal regions, and pelvic cul-de-sac. It was necessary to proceed with endovaginal exam following the transabdominal exam to visualize the ovaries and adnexal regions. Color and duplex Doppler ultrasound was utilized to evaluate blood flow to the ovaries. COMPARISON:  Abdominal and pelvic CT scan of Rashun Grattan 17, 2017. FINDINGS: Uterus The uterus is surgically absent. Right ovary Measurements: 4.9 x 4.3 x 4.3 cm. There is a fluid-filled right ovarian cystic structure measuring 2.6 x 2.1 x 1.8 cm. A few internal echoes are observed. Vascularity of the right ovary is normal. No solid-appearing right adnexal mass is observed. Left ovary The left ovary could not be  demonstrated Pulsed Doppler evaluation of the right ovary demonstrates normal low-resistance arterial and venous waveforms. Other findings No abnormal free fluid. IMPRESSION: Non simple appearing cystic structure in the right ovary containing internal echoes and an irregular wall. While this could reflect a hemorrhagic cyst an infected cyst could present in a similar fashion. Correlation with any pre hysterectomy imaging would be useful. The findings today are similar to those demonstrated on the postoperative CT scan of Khalib Fendley 17, 2017. No extra ovarian abscess or free fluid is observed. Electronically Signed   By: David  Martinique M.D.   On: 03/07/2016 07:41   US Pelvis Complete  03/07/2016  CLINICAL DATA:  10 hours of right lower quadrant pain ; history of previous abdominal hysterectomy EXAM: TRANSABDOMINAL AND TRANSVAGINAL ULTRASOUND OF PELVIS DOPPLER ULTRASOUND OF OVARIES TECHNIQUE: Both transabdominal and transvaginal ultrasound examinations of the pelvis were performed. Transabdominal technique was performed for global imaging of the pelvis including uterus, ovaries, adnexal regions, and pelvic cul-de-sac. It was necessary to proceed with endovaginal exam following the transabdominal exam to visualize the ovaries and adnexal regions. Color and duplex Doppler ultrasound was utilized to evaluate blood flow to the ovaries. COMPARISON:  Abdominal and pelvic CT scan of Jaquelyn Sakamoto 17, 2017. FINDINGS: Uterus The uterus is surgically absent. Right ovary Measurements: 4.9 x 4.3 x 4.3 cm. There is a fluid-filled right ovarian cystic structure measuring 2.6 x 2.1 x 1.8 cm. A few internal echoes are observed. Vascularity  of the right ovary is normal. No solid-appearing right adnexal mass is observed. Left ovary The left ovary could not be demonstrated Pulsed Doppler evaluation of the right ovary demonstrates normal low-resistance arterial and venous waveforms. Other findings No abnormal free fluid. IMPRESSION: Non simple  appearing cystic structure in the right ovary containing internal echoes and an irregular wall. While this could reflect a hemorrhagic cyst an infected cyst could present in a similar fashion. Correlation with any pre hysterectomy imaging would be useful. The findings today are similar to those demonstrated on the postoperative CT scan of Malayiah Mcbrayer 17, 2017. No extra ovarian abscess or free fluid is observed. Electronically Signed   By: David  Martinique M.D.   On: 03/07/2016 07:41   [place] Art/ven Flow Abd Pelv Doppler  03/07/2016  CLINICAL DATA:  10 hours of right lower quadrant pain ; history of previous abdominal hysterectomy EXAM: TRANSABDOMINAL AND TRANSVAGINAL ULTRASOUND OF PELVIS DOPPLER ULTRASOUND OF OVARIES TECHNIQUE: Both transabdominal and transvaginal ultrasound examinations of the pelvis were performed. Transabdominal technique was performed for global imaging of the pelvis including uterus, ovaries, adnexal regions, and pelvic cul-de-sac. It was necessary to proceed with endovaginal exam following the transabdominal exam to visualize the ovaries and adnexal regions. Color and duplex Doppler ultrasound was utilized to evaluate blood flow to the ovaries. COMPARISON:  Abdominal and pelvic CT scan of Cochise Dinneen 17, 2017. FINDINGS: Uterus The uterus is surgically absent. Right ovary Measurements: 4.9 x 4.3 x 4.3 cm. There is a fluid-filled right ovarian cystic structure measuring 2.6 x 2.1 x 1.8 cm. A few internal echoes are observed. Vascularity of the right ovary is normal. No solid-appearing right adnexal mass is observed. Left ovary The left ovary could not be demonstrated Pulsed Doppler evaluation of the right ovary demonstrates normal low-resistance arterial and venous waveforms. Other findings No abnormal free fluid. IMPRESSION: Non simple appearing cystic structure in the right ovary containing internal echoes and an irregular wall. While this could reflect a hemorrhagic cyst an infected cyst could present in a  similar fashion. Correlation with any pre hysterectomy imaging would be useful. The findings today are similar to those demonstrated on the postoperative CT scan of Antoniette Peake 17, 2017. No extra ovarian abscess or free fluid is observed. Electronically Signed   By: David  Martinique M.D.   On: 03/07/2016 07:41   Ct Renal Stone Study  03/07/2016  CLINICAL DATA:  Right flank pain with nausea and vomiting. Hysterectomy 5 weeks ago. Patient has been seen since surgery for pelvic swelling and stitches coming undone. EXAM: CT ABDOMEN AND PELVIS WITHOUT CONTRAST TECHNIQUE: Multidetector CT imaging of the abdomen and pelvis was performed following the standard protocol without IV contrast. COMPARISON:  None. FINDINGS: Lung bases are clear. Surgical absence of the gallbladder. The unenhanced appearance of the liver, spleen, pancreas, adrenal glands, abdominal aorta, inferior vena cava, and retroperitoneal lymph nodes is unremarkable. Kidneys are symmetrical in size and shape. No hydronephrosis or hydroureter. No renal, ureteral, or bladder stones. Bladder wall is not thickened. Stomach, small bowel, and colon are not abnormally distended. Scattered stool in the colon. No free air or free fluid in the abdomen. Pelvis: Surgical absence of the uterus. Infiltration in the low anterior pelvic wall consistent with postoperative changes at the incision site. No evidence of loculated fluid to suggest abscess. Prominent right adnexal structure measuring 4.6 cm, likely representing an enlarged ovary. Ultrasound suggested for better characterization. No free or loculated pelvic fluid collections. The appendix is normal. No destructive bone  lesions. IMPRESSION: Postoperative changes in the low anterior pelvic wall. No abscess. Enlarged right ovary. Ultrasound suggested for better characterization. No renal or ureteral stone or obstruction. Electronically Signed   By: Lucienne Capers M.D.   On: 03/07/2016 05:06    Case d/w Dr. Charlesetta Garibaldi.   Based on labs and ultrasound patient does not need antibiotics and will go home on NSAIDs office to call patient for follow up in one week.    This information was reported to the patient.  Strict return precautions given.    =I personally performed the services described in this documentation, which was scribed in my presence. The recorded information has been reviewed and is accurate.     Veatrice Kells, MD 03/07/16 [phone]

## 2016-03-07 NOTE — ED Notes (Signed)
Awake. Verbally responsive. A/O x4. Resp even and unlabored. No audible adventitious breath sounds noted. ABC's intact.  

## 2018-03-27 ENCOUNTER — Encounter: Payer: Self-pay | Admitting: Nurse Practitioner

## 2018-04-06 ENCOUNTER — Encounter: Payer: Self-pay | Admitting: Nurse Practitioner

## 2018-04-20 ENCOUNTER — Ambulatory Visit: Payer: BC Managed Care – PPO | Admitting: Nurse Practitioner

## 2018-04-24 ENCOUNTER — Ambulatory Visit: Payer: BC Managed Care – PPO | Admitting: Podiatry

## 2018-04-24 ENCOUNTER — Ambulatory Visit (INDEPENDENT_AMBULATORY_CARE_PROVIDER_SITE_OTHER): Payer: BC Managed Care – PPO

## 2018-04-24 ENCOUNTER — Encounter: Payer: Self-pay | Admitting: Podiatry

## 2018-04-24 VITALS — BP 126/82 | HR 72 | Resp 16

## 2018-04-24 DIAGNOSIS — M722 Plantar fascial fibromatosis: Secondary | ICD-10-CM | POA: Diagnosis not present

## 2018-04-24 MED ORDER — METHYLPREDNISOLONE 4 MG PO TBPK: ORAL_TABLET | ORAL | 0 refills | Status: DC

## 2018-04-24 MED ORDER — MELOXICAM 15 MG PO TABS: 15.0000 mg | ORAL_TABLET | Freq: Every day | ORAL | 3 refills | Status: DC

## 2018-04-24 NOTE — Progress Notes (Signed)
Subjective:  Patient ID: Marilyn Jensen, female    DOB: Nov 13, 1966,  MRN: [id number] HPI Chief Complaint  Patient presents with  . Foot Pain    Plantar heel bilateral (L>R) - aching x several months, AM pain, no treatment  . Toe Pain    Hallux left - tender x few months, no injury, swelling, sensitive if bumps shoes  . New Patient (Initial Visit)    52 y.o. female presents with the above complaint.   ROS: Denies fever chills nausea vomiting muscle aches pain calf pain back pain chest pain shortness of breath.  Past Medical History:  Diagnosis Date  . Anemia   . Anxiety   . Asthma   . Fibroid   . GERD (gastroesophageal reflux disease)    Past Surgical History:  Procedure Laterality Date  . ABDOMINAL HYSTERECTOMY  02/01/2016   Procedure: HYSTERECTOMY ABDOMINAL, Bilateral Salpingectomy, Lysis of Adhesions;  Surgeon: Crawford Givens, MD;  Location: [facility];  Service: Gynecology;;  Procedure #2  . CESAREAN SECTION    . CHOLECYSTECTOMY    . CYSTO  02/01/2016   Procedure: CYSTO;  Surgeon: Crawford Givens, MD;  Location: [facility];  Service: Gynecology;;  . EMBOLIZATION     fibroids  . LAPAROSCOPY  02/01/2016   Procedure: LAPAROSCOPY DIAGNOSTIC;  Surgeon: Crawford Givens, MD;  Location: [facility];  Service: Gynecology;;  Procedure #1  . MYOMECTOMY    . THYROIDECTOMY, PARTIAL    . under arm surgery     fatty tissue removal    Current Outpatient Medications:  .  albuterol (PROVENTIL HFA;VENTOLIN HFA) 108 (90 Base) MCG/ACT inhaler, Inhale 1 puff into the lungs every 6 (six) hours as needed for wheezing or shortness of breath., Disp: , Rfl:  .  clonazePAM (KLONOPIN) 0.5 MG tablet, clonazepam 0.5 mg tablet, Disp: , Rfl:  .  meloxicam (MOBIC) 15 MG tablet, Take 1 tablet (15 mg total) by mouth daily., Disp: 30 tablet, Rfl: 3 .  methylPREDNISolone (MEDROL DOSEPAK) 4 MG TBPK tablet, 6 day dose pack - take as directed, Disp: 21 tablet, Rfl: 0  No Known Allergies Review of Systems Objective:    Vitals:   04/24/18 1418  BP: 126/82  Pulse: 72  Resp: 16    General: Well developed, nourished, in no acute distress, alert and oriented x3   Dermatological: Skin is warm, dry and supple bilateral. Nails x 10 are well maintained; remaining integument appears unremarkable at this time. There are no open sores, no preulcerative lesions, no rash or signs of infection present.  Vascular: Dorsalis Pedis artery and Posterior Tibial artery pedal pulses are 2/4 bilateral with immedate capillary fill time. Pedal hair growth present. No varicosities and no lower extremity edema present bilateral.   Neruologic: Grossly intact via light touch bilateral. Vibratory intact via tuning fork bilateral. Protective threshold with Semmes Wienstein monofilament intact to all pedal sites bilateral. Patellar and Achilles deep tendon reflexes 2+ bilateral. No Babinski or clonus noted bilateral.   Musculoskeletal: No gross boney pedal deformities bilateral. No pain, crepitus, or limitation noted with foot and ankle range of motion bilateral. Muscular strength 5/5 in all groups tested bilateral.  She has pain on palpation medial calcaneal tubercle bilateral.  No pain on medial and lateral compression of the calcaneus.  She also has tenderness on palpation of the hallux interphalangeal joint hallux left with mild erythema no edema cellulitis drainage or odor in this area.  The graft did not demonstrate any type of osseous abnormalities in this area.  Gait: Unassisted, Nonantalgic.    Radiographs:  Radiographs taken today demonstrate plantar distally oriented calcaneal heel spur soft tissue increase in density plantar calcaneal insertion site no other major findings are noticed.  Assessment & Plan:   Assessment: Plantar fasciitis bilateral.  Chronic in nature.   Plan: Discussed etiology pathology conservative versus surgical therapies.  At this point injected her bilateral heels today 20 mg Kenalog 5 mg Marcaine  after sterile Betadine skin prep tolerated procedure well.  Start her on a Medrol Dosepak to be followed by meloxicam.  Placed her in bilateral plantar fascial braces and a single night splint.  Discussed appropriate shoe gear stretching exercise ice therapy and shoe gear modification.  Follow-up with her in 1 month     Leonette Tischer T. [place], [place]

## 2018-04-24 NOTE — Patient Instructions (Signed)

## 2018-05-03 ENCOUNTER — Encounter: Payer: Self-pay | Admitting: Nurse Practitioner

## 2018-05-03 ENCOUNTER — Ambulatory Visit: Payer: BC Managed Care – PPO | Admitting: Nurse Practitioner

## 2018-05-03 VITALS — BP 108/74 | HR 75 | Ht 69.0 in | Wt 213.0 lb

## 2018-05-03 DIAGNOSIS — K625 Hemorrhage of anus and rectum: Secondary | ICD-10-CM

## 2018-05-03 DIAGNOSIS — Z1211 Encounter for screening for malignant neoplasm of colon: Secondary | ICD-10-CM

## 2018-05-03 MED ORDER — NA SULFATE-K SULFATE-MG SULF 17.5-3.13-1.6 GM/177ML PO SOLN: ORAL | 0 refills | Status: DC

## 2018-05-03 NOTE — Progress Notes (Addendum)
Chief Complaint: episode of rectal bleeding  Referring Provider:     Minette Brine, FNP   ASSESSMENT AND PLAN;   52 yo female with isolated episode of rectal bleeding preceded by RLQ pain a few days prior. No associated bowel changes, no change in hgb.  -Patient is near age for screening colonoscopy, will proceed earlier given the episode of rectal bleeding. The risks and benefits of colonoscopy with possible polypectomy were discussed and the patient agrees to proceed.   HPI:    Patient is a 52 year old female with hx of asthma referred by PCP.  Approximately 6 weeks ago patient had an episode of rectal bleeding with bowel movement.  Prior to the episode of bleeding she had had mid to right lower abdominal pain for few days.  The pain was constant, no relationship to eating nor bowel movements.  Her bowel movements are normal.  She was not constipated when the bleeding occurred.  No associated urinary symptoms.  No fever.  No nausea or vomiting.  Patient says she has a history of ovarian cyst and thought maybe the pain was related to that though it was unusual that the pain was constant.  The pain resolved weeks ago.  Patient saw PCP a couple weeks after the bleeding episode.  Stool cards were completed and positive. Hemoglobin was at baseline at 12.5 with MCV of 86.   Patient is " sensitive" to many foods which may cause heartburn or abdominal cramps.  She is frequently eliminating certain foods from diet.  She takes Tums for occasional heartburn.  No other GI complaints.  Patient has never had a colonoscopy.  No family history colon cancer.   Past Medical History:  Diagnosis Date  . Anemia   . Anxiety   . Asthma   . Fibroid   . GERD (gastroesophageal reflux disease)      Past Surgical History:  Procedure Laterality Date  . ABDOMINAL HYSTERECTOMY  02/01/2016   Procedure: HYSTERECTOMY ABDOMINAL, Bilateral Salpingectomy, Lysis of Adhesions;  Surgeon: Crawford Givens, MD;   Location: [facility];  Service: Gynecology;;  Procedure #2  . CESAREAN SECTION    . CHOLECYSTECTOMY    . CYSTO  02/01/2016   Procedure: CYSTO;  Surgeon: Crawford Givens, MD;  Location: [facility];  Service: Gynecology;;  . EMBOLIZATION     fibroids  . LAPAROSCOPY  02/01/2016   Procedure: LAPAROSCOPY DIAGNOSTIC;  Surgeon: Crawford Givens, MD;  Location: [facility];  Service: Gynecology;;  Procedure #1  . MYOMECTOMY    . THYROIDECTOMY, PARTIAL    . under arm surgery     fatty tissue removal   History reviewed. No pertinent family history. Social History   Tobacco Use  . Smoking status: Never Smoker  . Smokeless tobacco: Never Used  Substance Use Topics  . Alcohol use: Yes    Comment: occ  . Drug use: No   Current Outpatient Medications  Medication Sig Dispense Refill  . albuterol (PROVENTIL HFA;VENTOLIN HFA) 108 (90 Base) MCG/ACT inhaler Inhale 1 puff into the lungs every 6 (six) hours as needed for wheezing or shortness of breath.    . clonazePAM (KLONOPIN) 0.5 MG tablet clonazepam 0.5 mg tablet    . meloxicam (MOBIC) 15 MG tablet Take 1 tablet (15 mg total) by mouth daily. 30 tablet 3  . Na Sulfate-K Sulfate-Mg Sulf 17.5-3.13-1.6 GM/177ML SOLN Suprep-Use as directed 354 mL 0   No current facility-administered medications for this visit.    No Known Allergies  Review of Systems: All systems reviewed and negative except where noted in HPI.   Creatinine clearance cannot be calculated (Patient's most recent lab result is older than the maximum 21 days allowed.)   Physical Exam:    Wt Readings from Last 3 Encounters:  05/03/18 213 lb (96.6 kg)  02/02/16 203 lb (92.1 kg)  01/26/16 203 lb 4 oz (92.2 kg)    BP 108/74   Pulse 75   Ht 5\' 9"  (1.753 m)   Wt 213 lb (96.6 kg)   LMP 01/11/2016 (Approximate)   BMI 31.45 kg/m  Constitutional:  Pleasant female in no acute distress. Psychiatric: Normal mood and affect. Behavior is normal. EENT: Pupils normal.  Conjunctivae are normal. No  scleral icterus. Neck supple.  Cardiovascular: Normal rate, regular rhythm. No edema Pulmonary/chest: Effort normal and breath sounds normal. No wheezing, rales or rhonchi. Abdominal: Soft, nondistended, nontender. Bowel sounds active throughout. There are no masses palpable. No hepatomegaly. Neurological: Alert and oriented to person place and time. Skin: Skin is warm and dry. No rashes noted.  Tye Savoy, NP  05/03/2018, 1:28 PM  Cc: Minette Brine, FNP

## 2018-05-03 NOTE — Patient Instructions (Signed)
If you are age 50 or older, your body mass index should be between 23-30. Your Body mass index is 31.45 kg/m. If this is out of the aforementioned range listed, please consider follow up with your Primary Care Provider.  If you are age 59 or younger, your body mass index should be between 19-25. Your Body mass index is 31.45 kg/m. If this is out of the aformentioned range listed, please consider follow up with your Primary Care Provider.   You have been scheduled for a colonoscopy. Please follow written instructions given to you at your visit today.  Please pick up your prep supplies at the pharmacy within the next 1-3 days. If you use inhalers (even only as needed), please bring them with you on the day of your procedure. Your physician has requested that you go to www.startemmi.com and enter the access code given to you at your visit today. This web site gives a general overview about your procedure. However, you should still follow specific instructions given to you by our office regarding your preparation for the procedure.  We have sent the following medications to your pharmacy for you to pick up at your convenience: [place] may have a light breakfast the morning of prep day (the day before the procedure).   You may choose from one of the following items: eggs and toast OR chicken noodle soup and crackers.   You should have your breakfast completed between 8:00 and 9:00 am the day before your procedure.    After you have had your light breakfast you should start a clear liquid diet only, NO SOLIDS. No additional solid food is allowed. You may continue to have clear liquid up to 3 hours prior to your procedure.   Thank you for choosing me and [facility].   Tye Savoy, NP

## 2018-05-04 NOTE — Progress Notes (Signed)
Thank you for sending this case to me. I have reviewed the entire note, and the outlined plan seems appropriate.  PLEASE CHECK YOUR NOTE - IT SEEMS THE ASSESSMENT/PLAN IS MISSING.  She is schedule for a colonoscopy with me on July 3rd, which I think is appropriate.  Wilfrid Lund, MD

## 2018-05-09 ENCOUNTER — Encounter: Payer: Self-pay | Admitting: Gastroenterology

## 2018-05-21 LAB — BASIC METABOLIC PANEL
BUN: 10 (ref 4–21)
Creatinine: 0.9 (ref 0.5–1.1)
GLUCOSE: 70
Potassium: 4.9 (ref 3.4–5.3)
Sodium: 138 (ref 137–147)

## 2018-05-21 LAB — CBC AND DIFFERENTIAL
HCT: 43 (ref 36–46)
HEMOGLOBIN: 13.7 (ref 12.0–16.0)
Platelets: 306 (ref 150–399)
WBC: 6.8

## 2018-05-21 LAB — LIPID PANEL
Cholesterol: 259 — AB (ref 0–200)
HDL: 89 — AB (ref 35–70)
LDL Cholesterol: 154
LDl/HDL Ratio: 1.7
TRIGLYCERIDES: 82 (ref 40–160)

## 2018-05-21 LAB — HEPATIC FUNCTION PANEL
ALT: 18 (ref 7–35)
AST: 24 (ref 13–35)
BILIRUBIN, TOTAL: 0.6

## 2018-05-21 LAB — HEMOGLOBIN A1C: Hemoglobin A1C: 5.7

## 2018-05-21 LAB — VITAMIN D 25 HYDROXY (VIT D DEFICIENCY, FRACTURES): VIT D 25 HYDROXY: 16.3

## 2018-05-22 ENCOUNTER — Ambulatory Visit: Payer: BC Managed Care – PPO | Admitting: Podiatry

## 2018-05-23 ENCOUNTER — Encounter: Payer: Self-pay | Admitting: Gastroenterology

## 2018-05-23 ENCOUNTER — Ambulatory Visit (AMBULATORY_SURGERY_CENTER): Payer: BC Managed Care – PPO | Admitting: Gastroenterology

## 2018-05-23 ENCOUNTER — Other Ambulatory Visit: Payer: Self-pay

## 2018-05-23 VITALS — BP 110/60 | HR 79 | Temp 98.6°F | Resp 16 | Ht 69.0 in | Wt 213.0 lb

## 2018-05-23 DIAGNOSIS — K921 Melena: Secondary | ICD-10-CM

## 2018-05-23 DIAGNOSIS — K625 Hemorrhage of anus and rectum: Secondary | ICD-10-CM

## 2018-05-23 DIAGNOSIS — Z1211 Encounter for screening for malignant neoplasm of colon: Secondary | ICD-10-CM

## 2018-05-23 MED ORDER — SODIUM CHLORIDE 0.9 % IV SOLN: 500.0000 mL | Freq: Once | INTRAVENOUS | Status: AC

## 2018-05-23 NOTE — Op Note (Signed)
[facility] Patient Name: Marilyn Jensen Procedure Date: 05/23/2018 2:13 PM MRN: [id number] Endoscopist: Mallie Mussel L. Loletha Carrow , MD Age: 52 Referring MD:  Date of Birth: 1966-05-31 Gender: Female Account #: [id number] Procedure:                Colonoscopy Indications:              Rectal bleeding Medicines:                Monitored Anesthesia Care Procedure:                Pre-Anesthesia Assessment:                           - Prior to the procedure, a History and Physical                            was performed, and patient medications and                            allergies were reviewed. The patient's tolerance of                            previous anesthesia was also reviewed. The risks                            and benefits of the procedure and the sedation                            options and risks were discussed with the patient.                            All questions were answered, and informed consent                            was obtained. Prior Anticoagulants: The patient has                            taken no previous anticoagulant or antiplatelet                            agents. ASA Grade Assessment: II - A patient with                            mild systemic disease. After reviewing the risks                            and benefits, the patient was deemed in                            satisfactory condition to undergo the procedure.                           After obtaining informed consent, the colonoscope  was passed under direct vision. Throughout the                            procedure, the patient's blood pressure, pulse, and                            oxygen saturations were monitored continuously. The                            Colonoscope was introduced through the anus and                            advanced to the the cecum, identified by                            appendiceal orifice and ileocecal valve. The                    colonoscopy was performed without difficulty. The                            patient tolerated the procedure well. The quality                            of the bowel preparation was excellent. The                            ileocecal valve, appendiceal orifice, and rectum                            were photographed. Scope In: 2:30:08 PM Scope Out: 2:44:12 PM Scope Withdrawal Time: 0 hours 9 minutes 24 seconds  Total Procedure Duration: 0 hours 14 minutes 4 seconds  Findings:                 The perianal and digital rectal examinations were                            normal.                           The entire examined colon appeared normal.                           Retroflexion in the rectum was not performed due to                            anatomy. Complications:            No immediate complications. Estimated Blood Loss:     Estimated blood loss: none. Impression:               - The entire examined colon is normal.                           - No specimens collected.  Single episode of benign anal bleeding. Recommendation:           - Patient has a contact number available for                            emergencies. The signs and symptoms of potential                            delayed complications were discussed with the                            patient. Return to normal activities tomorrow.                            Written discharge instructions were provided to the                            patient.                           - Resume previous diet.                           - Continue present medications.                           - Repeat colonoscopy in 10 years for screening                            purposes. Karrigan Messamore L. Loletha Carrow, MD 05/23/2018 2:49:53 PM This report has been signed electronically.

## 2018-05-23 NOTE — Progress Notes (Signed)
A and O x3. Report to RN. Tolerated MAC anesthesia well.

## 2018-05-23 NOTE — Patient Instructions (Signed)
Recall colonoscopy in 10 years.  YOU HAD AN ENDOSCOPIC PROCEDURE TODAY AT [facility]:   Refer to the procedure report that was given to you for any specific questions about what was found during the examination.  If the procedure report does not answer your questions, please call your gastroenterologist to clarify.  If you requested that your care partner not be given the details of your procedure findings, then the procedure report has been included in a sealed envelope for you to review at your convenience later.  YOU SHOULD EXPECT: Some feelings of bloating in the abdomen. Passage of more gas than usual.  Walking can help get rid of the air that was put into your GI tract during the procedure and reduce the bloating. If you had a lower endoscopy (such as a colonoscopy or flexible sigmoidoscopy) you may notice spotting of blood in your stool or on the toilet paper. If you underwent a bowel prep for your procedure, you may not have a normal bowel movement for a few days.  Please Note:  You might notice some irritation and congestion in your nose or some drainage.  This is from the oxygen used during your procedure.  There is no need for concern and it should clear up in a day or so.  SYMPTOMS TO REPORT IMMEDIATELY:   Following lower endoscopy (colonoscopy or flexible sigmoidoscopy):  Excessive amounts of blood in the stool  Significant tenderness or worsening of abdominal pains  Swelling of the abdomen that is new, acute  Fever of 100F or higher   For urgent or emergent issues, a gastroenterologist can be reached at any hour by calling [phone].   DIET:  We do recommend a small meal at first, but then you may proceed to your regular diet.  Drink plenty of fluids but you should avoid alcoholic beverages for 24 hours.  ACTIVITY:  You should plan to take it easy for the rest of today and you should NOT DRIVE or use heavy machinery until tomorrow (because of the sedation  medicines used during the test).    FOLLOW UP: Our staff will call the number listed on your records the next business day following your procedure to check on you and address any questions or concerns that you may have regarding the information given to you following your procedure. If we do not reach you, we will leave a message.  However, if you are feeling well and you are not experiencing any problems, there is no need to return our call.  We will assume that you have returned to your regular daily activities without incident.  If any biopsies were taken you will be contacted by phone or by letter within the next 1-3 weeks.  Please call us at [phone] if you have not heard about the biopsies in 3 weeks.    SIGNATURES/CONFIDENTIALITY: You and/or your care partner have signed paperwork which will be entered into your electronic medical record.  These signatures attest to the fact that that the information above on your After Visit Summary has been reviewed and is understood.  Full responsibility of the confidentiality of this discharge information lies with you and/or your care-partner.

## 2018-05-28 ENCOUNTER — Telehealth: Payer: Self-pay

## 2018-05-28 NOTE — Telephone Encounter (Signed)
  Follow up Call-  Call back number 05/23/2018  Post procedure Call Back phone  # [phone]  Permission to leave phone message Yes  Some recent data might be hidden     Patient questions:  Do you have a fever, pain , or abdominal swelling? No. Pain Score  0 *  Have you tolerated food without any problems? Yes.    Have you been able to return to your normal activities? Yes.    Do you have any questions about your discharge instructions: Diet   No. Medications  No. Follow up visit  No.  Do you have questions or concerns about your Care? No.  Actions: * If pain score is 4 or above: No action needed, pain <4.

## 2018-08-11 ENCOUNTER — Encounter: Payer: Self-pay | Admitting: Nurse Practitioner

## 2018-08-23 ENCOUNTER — Ambulatory Visit: Payer: BC Managed Care – PPO | Admitting: Nurse Practitioner

## 2018-08-23 ENCOUNTER — Encounter: Payer: Self-pay | Admitting: Nurse Practitioner

## 2018-08-23 VITALS — BP 140/90 | HR 75 | Temp 98.2°F | Ht 68.0 in | Wt 213.6 lb

## 2018-08-23 DIAGNOSIS — Z23 Encounter for immunization: Secondary | ICD-10-CM

## 2018-08-23 DIAGNOSIS — F419 Anxiety disorder, unspecified: Secondary | ICD-10-CM

## 2018-08-23 DIAGNOSIS — R030 Elevated blood-pressure reading, without diagnosis of hypertension: Secondary | ICD-10-CM | POA: Diagnosis not present
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
## 2018-08-23 NOTE — Patient Instructions (Signed)

## 2018-10-23 ENCOUNTER — Other Ambulatory Visit: Payer: Self-pay

## 2018-10-23 ENCOUNTER — Encounter

## 2018-10-23 ENCOUNTER — Ambulatory Visit: Payer: BC Managed Care – PPO | Admitting: Family Medicine

## 2018-10-23 ENCOUNTER — Encounter: Payer: Self-pay | Admitting: Family Medicine

## 2018-10-23 VITALS — BP 136/85 | HR 64 | Temp 98.8°F | Resp 16 | Ht 68.0 in | Wt 215.8 lb

## 2018-10-23 DIAGNOSIS — E785 Hyperlipidemia, unspecified: Secondary | ICD-10-CM | POA: Diagnosis not present

## 2018-10-23 DIAGNOSIS — I10 Essential (primary) hypertension: Secondary | ICD-10-CM | POA: Diagnosis not present

## 2018-10-23 LAB — LIPID PANEL
Chol/HDL Ratio: 2.9 ratio (ref 0.0–4.4)
Cholesterol, Total: 228 mg/dL — ABNORMAL HIGH (ref 100–199)
HDL: 79 mg/dL (ref >39)
LDL Calculated: 134 mg/dL — ABNORMAL HIGH (ref 0–99)
Triglycerides: 77 mg/dL (ref 0–149)
VLDL Cholesterol Cal: 15 mg/dL (ref 5–40)

## 2018-10-23 LAB — BASIC METABOLIC PANEL
BUN/Creatinine Ratio: 10 (ref 9–23)
BUN: 9 mg/dL (ref 6–24)
CO2: 23 mmol/L (ref 20–29)
Calcium: 9.7 mg/dL (ref 8.7–10.2)
Chloride: 101 mmol/L (ref 96–106)
Creatinine, Ser: 0.86 mg/dL (ref 0.57–1.00)
GFR calc Af Amer: 91 mL/min/{1.73_m2} (ref >59)
GFR, EST NON AFRICAN AMERICAN: 79 mL/min/{1.73_m2} (ref >59)
Glucose: 81 mg/dL (ref 65–99)
Potassium: 4 mmol/L (ref 3.5–5.2)
Sodium: 140 mmol/L (ref 134–144)
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
## 2018-10-23 NOTE — Patient Instructions (Addendum)
   If you have lab work done today you will be contacted with your lab results within the next 2 weeks.  If you have not heard from us then please contact us. The fastest way to get your results is to register for My Chart.   IF you received an x-ray today, you will receive an invoice from [facility]. Please contact [facility] at [phone] with questions or concerns regarding your invoice.   IF you received labwork today, you will receive an invoice from [facility]. Please contact [facility] at [phone] with questions or concerns regarding your invoice.   Our billing staff will not be able to assist you with questions regarding bills from these companies.  You will be contacted with the lab results as soon as they are available. The fastest way to get your results is to activate your My Chart account. Instructions are located on the last page of this paperwork. If you have not heard from us regarding the results in 2 weeks, please contact this office.     Hypertension Hypertension, commonly called high blood pressure, is when the force of blood pumping through the arteries is too strong. The arteries are the blood vessels that carry blood from the heart throughout the body. Hypertension forces the heart to work harder to pump blood and may cause arteries to become narrow or stiff. Having untreated or uncontrolled hypertension can cause heart attacks, strokes, kidney disease, and other problems. A blood pressure reading consists of a higher number over a lower number. Ideally, your blood pressure should be below 120/80. The first ("top") number is called the systolic pressure. It is a measure of the pressure in your arteries as your heart beats. The second ("bottom") number is called the diastolic pressure. It is a measure of the pressure in your arteries as the heart relaxes. What are the causes? The cause of this condition is not known. What increases the risk? Some  risk factors for high blood pressure are under your control. Others are not. Factors you can change  Smoking.  Having type 2 diabetes mellitus, high cholesterol, or both.  Not getting enough exercise or physical activity.  Being overweight.  Having too much fat, sugar, calories, or salt (sodium) in your diet.  Drinking too much alcohol. Factors that are difficult or impossible to change  Having chronic kidney disease.  Having a family history of high blood pressure.  Age. Risk increases with age.  Race. You may be at higher risk if you are African-American.  Gender. Men are at higher risk than women before age 45. After age 65, women are at higher risk than men.  Having obstructive sleep apnea.  Stress. What are the signs or symptoms? Extremely high blood pressure (hypertensive crisis) may cause:  Headache.  Anxiety.  Shortness of breath.  Nosebleed.  Nausea and vomiting.  Severe chest pain.  Jerky movements you cannot control (seizures).  How is this diagnosed? This condition is diagnosed by measuring your blood pressure while you are seated, with your arm resting on a surface. The cuff of the blood pressure monitor will be placed directly against the skin of your upper arm at the level of your heart. It should be measured at least twice using the same arm. Certain conditions can cause a difference in blood pressure between your right and left arms. Certain factors can cause blood pressure readings to be lower or higher than normal (elevated) for a short period of time:  When   your blood pressure is higher when you are in a health care provider's office than when you are at home, this is called white coat hypertension. Most people with this condition do not need medicines.  When your blood pressure is higher at home than when you are in a health care provider's office, this is called masked hypertension. Most people with this condition may need medicines to control  blood pressure.  If you have a high blood pressure reading during one visit or you have normal blood pressure with other risk factors:  You may be asked to return on a different day to have your blood pressure checked again.  You may be asked to monitor your blood pressure at home for 1 week or longer.  If you are diagnosed with hypertension, you may have other blood or imaging tests to help your health care provider understand your overall risk for other conditions. How is this treated? This condition is treated by making healthy lifestyle changes, such as eating healthy foods, exercising more, and reducing your alcohol intake. Your health care provider may prescribe medicine if lifestyle changes are not enough to get your blood pressure under control, and if:  Your systolic blood pressure is above 130.  Your diastolic blood pressure is above 80.  Your personal target blood pressure may vary depending on your medical conditions, your age, and other factors. Follow these instructions at home: Eating and drinking  Eat a diet that is high in fiber and potassium, and low in sodium, added sugar, and fat. An example eating plan is called the DASH (Dietary Approaches to Stop Hypertension) diet. To eat this way: ? Eat plenty of fresh fruits and vegetables. Try to fill half of your plate at each meal with fruits and vegetables. ? Eat whole grains, such as whole wheat pasta, brown rice, or whole grain bread. Fill about one quarter of your plate with whole grains. ? Eat or drink low-fat dairy products, such as skim milk or low-fat yogurt. ? Avoid fatty cuts of meat, processed or cured meats, and poultry with skin. Fill about one quarter of your plate with lean proteins, such as fish, chicken without skin, beans, eggs, and tofu. ? Avoid premade and processed foods. These tend to be higher in sodium, added sugar, and fat.  Reduce your daily sodium intake. Most people with hypertension should eat less  than 1,500 mg of sodium a day.  Limit alcohol intake to no more than 1 drink a day for nonpregnant women and 2 drinks a day for men. One drink equals 12 oz of beer, 5 oz of wine, or 1 oz of hard liquor. Lifestyle  Work with your health care provider to maintain a healthy body weight or to lose weight. Ask what an ideal weight is for you.  Get at least 30 minutes of exercise that causes your heart to beat faster (aerobic exercise) most days of the week. Activities may include walking, swimming, or biking.  Include exercise to strengthen your muscles (resistance exercise), such as pilates or lifting weights, as part of your weekly exercise routine. Try to do these types of exercises for 30 minutes at least 3 days a week.  Do not use any products that contain nicotine or tobacco, such as cigarettes and e-cigarettes. If you need help quitting, ask your health care provider.  Monitor your blood pressure at home as told by your health care provider.  Keep all follow-up visits as told by your health care provider.   This is important. Medicines  Take over-the-counter and prescription medicines only as told by your health care provider. Follow directions carefully. Blood pressure medicines must be taken as prescribed.  Do not skip doses of blood pressure medicine. Doing this puts you at risk for problems and can make the medicine less effective.  Ask your health care provider about side effects or reactions to medicines that you should watch for. Contact a health care provider if:  You think you are having a reaction to a medicine you are taking.  You have headaches that keep coming back (recurring).  You feel dizzy.  You have swelling in your ankles.  You have trouble with your vision. Get help right away if:  You develop a severe headache or confusion.  You have unusual weakness or numbness.  You feel faint.  You have severe pain in your chest or abdomen.  You vomit  repeatedly.  You have trouble breathing. Summary  Hypertension is when the force of blood pumping through your arteries is too strong. If this condition is not controlled, it may put you at risk for serious complications.  Your personal target blood pressure may vary depending on your medical conditions, your age, and other factors. For most people, a normal blood pressure is less than 120/80.  Hypertension is treated with lifestyle changes, medicines, or a combination of both. Lifestyle changes include weight loss, eating a healthy, low-sodium diet, exercising more, and limiting alcohol. This information is not intended to replace advice given to you by your health care provider. Make sure you discuss any questions you have with your health care provider. Document Released: 11/07/2005 Document Revised: 10/05/2016 Document Reviewed: 10/05/2016 Elsevier Interactive Patient Education  2018 [employer] Inc[employer]  

## 2018-11-07 ENCOUNTER — Telehealth: Payer: Self-pay | Admitting: Family Medicine

## 2018-11-07 NOTE — Telephone Encounter (Signed)
Received records from [facility]

## 2018-11-20 ENCOUNTER — Encounter: Payer: Self-pay | Admitting: Podiatry

## 2018-11-20 ENCOUNTER — Ambulatory Visit: Payer: BC Managed Care – PPO | Admitting: Podiatry

## 2018-11-20 DIAGNOSIS — M722 Plantar fascial fibromatosis: Secondary | ICD-10-CM

## 2018-11-20 NOTE — Progress Notes (Signed)
She presents today for flareup of bilateral plantar fasciitis for the past 2 weeks.  Denies fever chills nausea vomiting muscle aches pains calf pain back pain chest pain shortness of breath.  Objective: Vital signs are stable alert and oriented x3 pain on palpation medial cognate calcaneal Q tubercle bilateral.  Assessment: Chronic right plantar fasciitis.  Plan: Discussed appropriate shoe gear stretching exercise ice therapy sugar modifications injected her bilateral heels once again today" 20 mg Kenalog 5 mg Marcaine point maximal tenderness bilaterally.  Tolerated procedure well noting that it is not as painful as previously was.

## 2018-11-21 HISTORY — PX: BREAST REDUCTION SURGERY: SHX8

## 2018-12-01 ENCOUNTER — Ambulatory Visit: Payer: BC Managed Care – PPO | Admitting: Osteopathic Medicine

## 2019-01-08 ENCOUNTER — Telehealth: Payer: Self-pay | Admitting: Family Medicine

## 2019-01-08 NOTE — Telephone Encounter (Signed)
Called and spoke with pt regarding their appt with Dr. Nolon Rod on 6/8. Dr Nolon Rod is out of the office that day. I was able to get pt rescheduled for 6/5 with Dr. Nolon Rod. I advised of time and late policy. Pt acknowledged.

## 2019-01-14 ENCOUNTER — Ambulatory Visit: Payer: BC Managed Care – PPO | Admitting: Emergency Medicine

## 2019-01-14 ENCOUNTER — Encounter: Payer: Self-pay | Admitting: Emergency Medicine

## 2019-01-14 ENCOUNTER — Encounter

## 2019-01-14 ENCOUNTER — Other Ambulatory Visit: Payer: Self-pay | Admitting: Emergency Medicine

## 2019-01-14 ENCOUNTER — Other Ambulatory Visit: Payer: Self-pay

## 2019-01-14 VITALS — BP 129/79 | HR 68 | Temp 98.2°F | Resp 16 | Ht 69.0 in | Wt 211.2 lb

## 2019-01-14 DIAGNOSIS — M546 Pain in thoracic spine: Secondary | ICD-10-CM

## 2019-01-14 DIAGNOSIS — G8929 Other chronic pain: Secondary | ICD-10-CM | POA: Diagnosis not present

## 2019-01-14 NOTE — Patient Instructions (Addendum)
   If you have lab work done today you will be contacted with your lab results within the next 2 weeks.  If you have not heard from us then please contact us. The fastest way to get your results is to register for My Chart.   IF you received an x-ray today, you will receive an invoice from [facility]. Please contact [facility] at [phone] with questions or concerns regarding your invoice.   IF you received labwork today, you will receive an invoice from [facility]. Please contact [facility] at [phone] with questions or concerns regarding your invoice.   Our billing staff will not be able to assist you with questions regarding bills from these companies.  You will be contacted with the lab results as soon as they are available. The fastest way to get your results is to activate your My Chart account. Instructions are located on the last page of this paperwork. If you have not heard from us regarding the results in 2 weeks, please contact this office.     Chronic Back Pain When back pain lasts longer than 3 months, it is called chronic back pain. Pain may get worse at certain times (flare-ups). There are things you can do at home to manage your pain. Follow these instructions at home: Activity      Avoid bending and other activities that make pain worse.  When standing: ? Keep your upper back and neck straight. ? Keep your shoulders pulled back. ? Avoid slouching.  When sitting: ? Keep your back straight. ? Relax your shoulders. Do not round your shoulders or pull them backward.  Do not sit or stand in one place for long periods of time.  Take short rest breaks during the day. Lying down or standing is usually better than sitting. Resting can help relieve pain.  When sitting or lying down for a long time, do some mild activity or stretching. This will help to prevent stiffness and pain.  Get regular exercise. Ask your doctor what activities are safe  for you.  Do not lift anything that is heavier than 10 lb (4.5 kg). To prevent injury when you lift things: ? Bend your knees. ? Keep the weight close to your body. ? Avoid twisting. Managing pain  If told, put ice on the painful area. Your doctor may tell you to use ice for 24-48 hours after a flare-up starts. ? Put ice in a plastic bag. ? Place a towel between your skin and the bag. ? Leave the ice on for 20 minutes, 2-3 times a day.  If told, put heat on the painful area as often as told by your doctor. Use the heat source that your doctor recommends, such as a moist heat pack or a heating pad. ? Place a towel between your skin and the heat source. ? Leave the heat on for 20-30 minutes. ? Remove the heat if your skin turns bright red. This is especially important if you are unable to feel pain, heat, or cold. You may have a greater risk of getting burned.  Soak in a warm bath. This can help relieve pain.  Take over-the-counter and prescription medicines only as told by your doctor. General instructions  Sleep on a firm mattress. Try lying on your side with your knees slightly bent. If you lie on your back, put a pillow under your knees.  Keep all follow-up visits as told by your doctor. This is important. Contact a doctor if:    You have pain that does not get better with rest or medicine. Get help right away if:  One or both of your arms or legs feel weak.  One or both of your arms or legs lose feeling (numbness).  You have trouble controlling when you poop (bowel movement) or pee (urinate).  You feel sick to your stomach (nauseous).  You throw up (vomit).  You have belly (abdominal) pain.  You have shortness of breath.  You pass out (faint). Summary  When back pain lasts longer than 3 months, it is called chronic back pain.  Pain may get worse at certain times (flare-ups).  Use ice and heat as told by your doctor. Your doctor may tell you to use ice after  flare-ups. This information is not intended to replace advice given to you by your health care provider. Make sure you discuss any questions you have with your health care provider. Document Released: 04/25/2008 Document Revised: 06/22/2017 Document Reviewed: 06/22/2017 Elsevier Interactive Patient Education  2019 [employer]  

## 2019-01-14 NOTE — Progress Notes (Signed)
Marilyn Jensen 53 y.o.   Chief Complaint  Patient presents with  . Back Pain    per patient she will have plastic surgery consult for breast reduction     HISTORY OF PRESENT ILLNESS: This is a 53 y.o. female planning on undergoing breast reduction surgery in the near future.  She has had almost daily mid and upper burning back pain for the past 5 to 10 years.  Brought on by prolonged standing, high impact exercise, movement.  Better with rest.  Advil sometimes helps.  Walking is not too bad.  Also has a history of intermittent GERD symptoms.  Has a history of breast nodules and cysts.  No other significant symptoms or complaints today.  HPI   Prior to Admission medications   Medication Sig Start Date End Date Taking? Authorizing Provider  albuterol (PROVENTIL HFA;VENTOLIN HFA) 108 (90 Base) MCG/ACT inhaler Inhale 1 puff into the lungs every 6 (six) hours as needed for wheezing or shortness of breath.   Yes [provider]  clonazePAM (KLONOPIN) 0.5 MG tablet clonazepam 0.5 mg tablet   Yes [provider]    No Known Allergies  There are no active problems to display for this patient.   Past Medical History:  Diagnosis Date  . Anemia   . Anxiety   . Asthma   . Blood transfusion without reported diagnosis   . Fibroid   . GERD (gastroesophageal reflux disease)     Past Surgical History:  Procedure Laterality Date  . ABDOMINAL HYSTERECTOMY  02/01/2016   Procedure: HYSTERECTOMY ABDOMINAL, Bilateral Salpingectomy, Lysis of Adhesions;  Surgeon: Crawford Givens, MD;  Location: [facility];  Service: Gynecology;;  Procedure #2  . CESAREAN SECTION    . CHOLECYSTECTOMY    . CYSTO  02/01/2016   Procedure: CYSTO;  Surgeon: Crawford Givens, MD;  Location: [facility];  Service: Gynecology;;  . EMBOLIZATION     fibroids  . LAPAROSCOPY  02/01/2016   Procedure: LAPAROSCOPY DIAGNOSTIC;  Surgeon: Crawford Givens, MD;  Location: [facility];  Service: Gynecology;;  Procedure #1  . MYOMECTOMY      . THYROIDECTOMY, PARTIAL    . under arm surgery     fatty tissue removal    Social History   Socioeconomic History  . Marital status: Divorced    Spouse name: Not on file  . Number of children: Not on file  . Years of education: Not on file  . Highest education level: Not on file  Occupational History  . Not on file  Social Needs  . Financial resource strain: Not on file  . Food insecurity:    Worry: Not on file    Inability: Not on file  . Transportation needs:    Medical: Not on file    Non-medical: Not on file  Tobacco Use  . Smoking status: Never Smoker  . Smokeless tobacco: Never Used  Substance and Sexual Activity  . Alcohol use: Yes    Comment: occ  . Drug use: No  . Sexual activity: Not on file  Lifestyle  . Physical activity:    Days per week: Not on file    Minutes per session: Not on file  . Stress: Not on file  Relationships  . Social connections:    Talks on phone: Not on file    Gets together: Not on file    Attends religious service: Not on file    Active member of club or organization: Not on file    Attends  meetings of clubs or organizations: Not on file    Relationship status: Not on file  . Intimate partner violence:    Fear of current or ex partner: Not on file    Emotionally abused: Not on file    Physically abused: Not on file    Forced sexual activity: Not on file  Other Topics Concern  . Not on file  Social History Narrative  . Not on file    No family history on file.   Review of Systems  Constitutional: Negative.  Negative for chills and fever.  HENT: Negative.   Eyes: Negative.   Respiratory: Negative.  Negative for cough and shortness of breath.   Cardiovascular: Negative.  Negative for chest pain and palpitations.  Gastrointestinal: Negative.  Negative for abdominal pain, diarrhea, nausea and vomiting.  Genitourinary: Negative.  Negative for dysuria and hematuria.  Musculoskeletal: Positive for back pain (Chronic).   Skin: Negative.  Negative for rash.  Neurological: Negative.  Negative for dizziness, sensory change, focal weakness and headaches.  Endo/Heme/Allergies: Negative.   All other systems reviewed and are negative.    Vitals:   01/14/19 0812  BP: 129/79  Pulse: 68  Resp: 16  Temp: 98.2 F (36.8 C)  SpO2: 99%     Physical Exam Vitals signs reviewed.  Constitutional:      Appearance: Normal appearance.  HENT:     Head: Normocephalic and atraumatic.     Mouth/Throat:     Mouth: Mucous membranes are moist.     Pharynx: Oropharynx is clear.  Eyes:     Extraocular Movements: Extraocular movements intact.     Conjunctiva/sclera: Conjunctivae normal.     Pupils: Pupils are equal, round, and reactive to light.  Neck:     Musculoskeletal: Neck supple.  Cardiovascular:     Rate and Rhythm: Normal rate and regular rhythm.     Heart sounds: Normal heart sounds.  Pulmonary:     Effort: Pulmonary effort is normal.     Breath sounds: Normal breath sounds.  Musculoskeletal: Normal range of motion.     Cervical back: Normal.     Thoracic back: Normal. She exhibits normal range of motion, no tenderness and no bony tenderness.  Skin:    General: Skin is warm and dry.     Capillary Refill: Capillary refill takes less than 2 seconds.  Neurological:     General: No focal deficit present.     Mental Status: She is alert and oriented to person, place, and time.  Psychiatric:        Mood and Affect: Mood normal.        Behavior: Behavior normal.      ASSESSMENT & PLAN: Marilyn Jensen was seen today for back pain.  Diagnoses and all orders for this visit:  Chronic bilateral thoracic back pain    Patient Instructions       If you have lab work done today you will be contacted with your lab results within the next 2 weeks.  If you have not heard from [place] then please contact us. The fastest way to get your results is to register for My Chart.   IF you received an x-ray today, you  will receive an invoice from [facility]. Please contact [facility] at [phone] with questions or concerns regarding your invoice.   IF you received labwork today, you will receive an invoice from [place]. Please contact [facility] at [phone] with questions or concerns regarding your invoice.   Our  billing staff will not be able to assist you with questions regarding bills from these companies.  You will be contacted with the lab results as soon as they are available. The fastest way to get your results is to activate your My Chart account. Instructions are located on the last page of this paperwork. If you have not heard from [place] regarding the results in 2 weeks, please contact this office.     Chronic Back Pain When back pain lasts longer than 3 months, it is called chronic back pain. Pain may get worse at certain times (flare-ups). There are things you can do at home to manage your pain. Follow these instructions at home: Activity      Avoid bending and other activities that make pain worse.  When standing: ? Keep your upper back and neck straight. ? Keep your shoulders pulled back. ? Avoid slouching.  When sitting: ? Keep your back straight. ? Relax your shoulders. Do not round your shoulders or pull them backward.  Do not sit or stand in one place for long periods of time.  Take short rest breaks during the day. Lying down or standing is usually better than sitting. Resting can help relieve pain.  When sitting or lying down for a long time, do some mild activity or stretching. This will help to prevent stiffness and pain.  Get regular exercise. Ask your doctor what activities are safe for you.  Do not lift anything that is heavier than 10 lb (4.5 kg). To prevent injury when you lift things: ? Bend your knees. ? Keep the weight close to your body. ? Avoid twisting. Managing pain  If told, put ice on the painful area. Your doctor may tell you to  use ice for 24-48 hours after a flare-up starts. ? Put ice in a plastic bag. ? Place a towel between your skin and the bag. ? Leave the ice on for 20 minutes, 2-3 times a day.  If told, put heat on the painful area as often as told by your doctor. Use the heat source that your doctor recommends, such as a moist heat pack or a heating pad. ? Place a towel between your skin and the heat source. ? Leave the heat on for 20-30 minutes. ? Remove the heat if your skin turns bright red. This is especially important if you are unable to feel pain, heat, or cold. You may have a greater risk of getting burned.  Soak in a warm bath. This can help relieve pain.  Take over-the-counter and prescription medicines only as told by your doctor. General instructions  Sleep on a firm mattress. Try lying on your side with your knees slightly bent. If you lie on your back, put a pillow under your knees.  Keep all follow-up visits as told by your doctor. This is important. Contact a doctor if:  You have pain that does not get better with rest or medicine. Get help right away if:  One or both of your arms or legs feel weak.  One or both of your arms or legs lose feeling (numbness).  You have trouble controlling when you poop (bowel movement) or pee (urinate).  You feel sick to your stomach (nauseous).  You throw up (vomit).  You have belly (abdominal) pain.  You have shortness of breath.  You pass out (faint). Summary  When back pain lasts longer than 3 months, it is called chronic back pain.  Pain may get worse at  certain times (flare-ups).  Use ice and heat as told by your doctor. Your doctor may tell you to use ice after flare-ups. This information is not intended to replace advice given to you by your health care provider. Make sure you discuss any questions you have with your health care provider. Document Released: 04/25/2008 Document Revised: 06/22/2017 Document Reviewed:  06/22/2017 Elsevier Interactive Patient Education  2019 Elsevier Inc.      Agustina Caroli, MD [facility]

## 2019-02-02 ENCOUNTER — Other Ambulatory Visit: Payer: Self-pay | Admitting: Nurse Practitioner

## 2019-02-02 ENCOUNTER — Encounter: Payer: Self-pay | Admitting: Family Medicine

## 2019-02-04 NOTE — Telephone Encounter (Signed)
Can pt have a refill on this med 

## 2019-04-26 ENCOUNTER — Ambulatory Visit (INDEPENDENT_AMBULATORY_CARE_PROVIDER_SITE_OTHER): Payer: BC Managed Care – PPO | Admitting: Family Medicine

## 2019-04-26 ENCOUNTER — Encounter: Payer: Self-pay | Admitting: Family Medicine

## 2019-04-26 ENCOUNTER — Other Ambulatory Visit: Payer: Self-pay

## 2019-04-26 VITALS — BP 138/88 | HR 87 | Temp 98.2°F | Ht 69.0 in | Wt 217.2 lb

## 2019-04-26 DIAGNOSIS — J45990 Exercise induced bronchospasm: Secondary | ICD-10-CM | POA: Diagnosis not present

## 2019-04-26 DIAGNOSIS — E785 Hyperlipidemia, unspecified: Secondary | ICD-10-CM

## 2019-04-26 DIAGNOSIS — Z5181 Encounter for therapeutic drug level monitoring: Secondary | ICD-10-CM

## 2019-04-26 DIAGNOSIS — I10 Essential (primary) hypertension: Secondary | ICD-10-CM

## 2019-04-26 MED ORDER — ALBUTEROL SULFATE HFA 108 (90 BASE) MCG/ACT IN AERS: INHALATION_SPRAY | RESPIRATORY_TRACT | 3 refills | Status: AC

## 2019-04-26 NOTE — Progress Notes (Signed)
Established Patient Office Visit  Subjective:  Patient ID: Marilyn Jensen, female    DOB: Sep 04, 1966  Age: 53 y.o. MRN: [id number]  CC:  Chief Complaint  Patient presents with  . Hypertension    f/u- 6 mth  . Anxiety    screening done    HPI Marilyn Jensen presents for   Hypertension: Patient here for follow-up of elevated blood pressure. She is exercising by riding her bicycle for 3 miles and is adherent to low salt diet.  Blood pressure is well controlled at home. Cardiac symptoms none. Patient denies chest pain, chest pressure/discomfort, dyspnea, exertional chest pressure/discomfort and fatigue.  Cardiovascular risk factors: dyslipidemia and hypertension. Use of agents associated with hypertension: none. History of target organ damage: none.  BP Readings from Last 3 Encounters:  04/26/19 138/88  01/14/19 129/79  10/23/18 136/85    She has exercise-induced asthma She states that she uses her albuterol which she uses before exercise She has been riding her bicycle And she still gets shortness of breath with exercise She notices more fatigue with exercise   Dyslipidemia: Patient presents for evaluation of lipids.  Compliance with treatment thus far has been excellent.  A repeat fasting lipid profile was done.  The patient does not use medications that may worsen dyslipidemias (corticosteroids, progestins, anabolic steroids, diuretics, beta-blockers, amiodarone, cyclosporine, olanzapine).  The patient is not known to have coexisting coronary artery disease.   The 10-year ASCVD risk score Mikey Bussing DC Brooke Bonito., et al., 2013) is: 2%   Values used to calculate the score:     Age: 68 years     Sex: Female     Is Non-Hispanic African American: Yes     Diabetic: No     Tobacco smoker: No     Systolic Blood Pressure: 678 mmHg     Is BP treated: No     HDL Cholesterol: 73 mg/dL     Total Cholesterol: 227 mg/dL     Wt Readings from Last 3 Encounters:  04/26/19 217 lb 3.2 oz (98.5  kg)  01/14/19 211 lb 3.2 oz (95.8 kg)  10/23/18 215 lb 12.8 oz (97.9 kg)      Past Medical History:  Diagnosis Date  . Anemia   . Anxiety   . Asthma   . Blood transfusion without reported diagnosis   . Fibroid   . GERD (gastroesophageal reflux disease)     Past Surgical History:  Procedure Laterality Date  . ABDOMINAL HYSTERECTOMY  02/01/2016   Procedure: HYSTERECTOMY ABDOMINAL, Bilateral Salpingectomy, Lysis of Adhesions;  Surgeon: Crawford Givens, MD;  Location: [facility];  Service: Gynecology;;  Procedure #2  . CESAREAN SECTION    . CHOLECYSTECTOMY    . CYSTO  02/01/2016   Procedure: CYSTO;  Surgeon: Crawford Givens, MD;  Location: [facility];  Service: Gynecology;;  . EMBOLIZATION     fibroids  . LAPAROSCOPY  02/01/2016   Procedure: LAPAROSCOPY DIAGNOSTIC;  Surgeon: Crawford Givens, MD;  Location: [facility];  Service: Gynecology;;  Procedure #1  . MYOMECTOMY    . THYROIDECTOMY, PARTIAL    . under arm surgery     fatty tissue removal    History reviewed. No pertinent family history.  Social History   Socioeconomic History  . Marital status: Divorced    Spouse name: Not on file  . Number of children: 1  . Years of education: Not on file  . Highest education level: Not on file  Occupational History  . Not on file  Social Needs  . Financial resource strain: Not on file  . Food insecurity:    Worry: Not on file    Inability: Not on file  . Transportation needs:    Medical: Not on file    Non-medical: Not on file  Tobacco Use  . Smoking status: Never Smoker  . Smokeless tobacco: Never Used  Substance and Sexual Activity  . Alcohol use: Yes    Comment: occ  . Drug use: No  . Sexual activity: Not Currently  Lifestyle  . Physical activity:    Days per week: Not on file    Minutes per session: Not on file  . Stress: Not on file  Relationships  . Social connections:    Talks on phone: Not on file    Gets together: Not on file    Attends religious service: Not on file     Active member of club or organization: Not on file    Attends meetings of clubs or organizations: Not on file    Relationship status: Not on file  . Intimate partner violence:    Fear of current or ex partner: Not on file    Emotionally abused: Not on file    Physically abused: Not on file    Forced sexual activity: Not on file  Other Topics Concern  . Not on file  Social History Narrative  . Not on file    Outpatient Medications Prior to Visit  Medication Sig Dispense Refill  . clonazePAM (KLONOPIN) 0.5 MG tablet TAKE 1 TABLET BY MOUTH TWICE A DAY AS NEEDED 30 tablet 0  . PROAIR HFA 108 (90 Base) MCG/ACT inhaler INHALE 2 PUFFS BY MOUTH EVERY 4-6 HRS AS NEEDED 8.5 Inhaler 4   Facility-Administered Medications Prior to Visit  Medication Dose Route Frequency Provider Last Rate Last Dose  . 0.9 %  sodium chloride infusion  500 mL Intravenous Once Armbruster, Carlota Raspberry, MD      . 0.9 %  sodium chloride infusion  500 mL Intravenous Once Armbruster, Carlota Raspberry, MD        No Known Allergies  ROS Review of Systems Review of Systems  Constitutional: Negative for activity change, appetite change, chills and fever.  HENT: Negative for congestion, nosebleeds, trouble swallowing and voice change.   Respiratory: Negative for cough, shortness of breath and wheezing.   Gastrointestinal: Negative for diarrhea, nausea and vomiting.  Genitourinary: Negative for difficulty urinating, dysuria, flank pain and hematuria.  Musculoskeletal: Negative for back pain, joint swelling and neck pain.  Neurological: Negative for dizziness, speech difficulty, light-headedness and numbness.  See HPI. All other review of systems negative.     Objective:    Physical Exam  BP 138/88   Pulse 87   Temp 98.2 F (36.8 C) (Oral)   Ht 5' 9" (1.753 m)   Wt 217 lb 3.2 oz (98.5 kg)   LMP 01/11/2016 (Approximate)   SpO2 98%   BMI 32.07 kg/m  Wt Readings from Last 3 Encounters:  04/26/19 217 lb 3.2 oz (98.5 kg)   01/14/19 211 lb 3.2 oz (95.8 kg)  10/23/18 215 lb 12.8 oz (97.9 kg)     There are no preventive care reminders to display for this patient.  There are no preventive care reminders to display for this patient.  No results found for: TSH Lab Results  Component Value Date   WBC 6.8 05/21/2018   HGB 13.7 05/21/2018   HCT 43 05/21/2018   MCV 74.5 (L) 03/07/2016  PLT 306 05/21/2018   Lab Results  Component Value Date   NA 144 04/26/2019   K 4.2 04/26/2019   CO2 21 04/26/2019   GLUCOSE 94 04/26/2019   BUN 9 04/26/2019   CREATININE 0.74 04/26/2019   BILITOT 0.5 04/26/2019   ALKPHOS 91 04/26/2019   AST 27 04/26/2019   ALT 32 04/26/2019   PROT 8.0 04/26/2019   ALBUMIN 4.4 04/26/2019   CALCIUM 9.8 04/26/2019   ANIONGAP 6 01/26/2016   Lab Results  Component Value Date   CHOL 227 (H) 04/26/2019   Lab Results  Component Value Date   HDL 73 04/26/2019   Lab Results  Component Value Date   LDLCALC 141 (H) 04/26/2019   Lab Results  Component Value Date   TRIG 64 04/26/2019   Lab Results  Component Value Date   CHOLHDL 3.1 04/26/2019   Lab Results  Component Value Date   HGBA1C 5.7 05/21/2018      Assessment & Plan:   Problem List Items Addressed This Visit    None    Visit Diagnoses    Dyslipidemia    -  Primary Discussed medications that affect lipids Discussed current treatment : lifestyle modification  Advised dietary fiber and fish oil and ways to keep HDL high    Relevant Orders   CMP14+EGFR (Completed)   Lipid panel (Completed)   Exercise-induced asthma    -  Advised using her albuterol prior to exercise and a second dose while exercising Also advised that she take a break during strenuous exercise and cool off Avoid overexposure to heat   Relevant Medications   albuterol (PROAIR HFA) 108 (90 Base) MCG/ACT inhaler   Well-controlled hypertension    - Patient's blood pressure is at goal of 139/89 or less. Condition is stable. Continue current  medications and treatment plan. I recommend that you exercise for 30-45 minutes 5 days a week. I also recommend a balanced diet with fruits and vegetables every day, lean meats, and little fried foods. The DASH diet (you can find this online) is a good example of this.    Relevant Orders   CMP14+EGFR (Completed)   Lipid panel (Completed)      Meds ordered this encounter  Medications  . albuterol (PROAIR HFA) 108 (90 Base) MCG/ACT inhaler    Sig: INHALE 2 PUFFS BY MOUTH EVERY 4-6 HRS AS NEEDED    Dispense:  8.5 Inhaler    Refill:  3    Follow-up: Return in about 3 months (around 07/27/2019) for weight check and hypertension .    Forrest Moron, MD

## 2019-04-26 NOTE — Patient Instructions (Addendum)
If you have lab work done today you will be contacted with your lab results within the next 2 weeks.  If you have not heard from [place] then please contact us. The fastest way to get your results is to register for My Chart.   IF you received an x-ray today, you will receive an invoice from [facility]. Please contact [facility] at [phone] with questions or concerns regarding your invoice.   IF you received labwork today, you will receive an invoice from [place]. Please contact [facility] at [phone] with questions or concerns regarding your invoice.   Our billing staff will not be able to assist you with questions regarding bills from these companies.  You will be contacted with the lab results as soon as they are available. The fastest way to get your results is to activate your My Chart account. Instructions are located on the last page of this paperwork. If you have not heard from [place] regarding the results in 2 weeks, please contact this office.     Exercise-Induced Bronchoconstriction, Adult  Exercise-induced bronchospasm (EIB) happens when the airways narrow during or after exercise. The airways are the passages that lead from the nose and mouth down into the lungs. When the airways narrow, this can cause coughing, wheezing, and shortness of breath. Anyone can develop this condition, even those who do not have asthma or allergies. To help prevent episodes of EIB, you may need to take medicine or change your workout routine. You should tell your coach, teammates, or workout partners about your condition so they know how to help you if you do have an episode. What are the causes? The exact cause of this condition is not known. Symptoms are brought on (triggered) by physical activity. EIB can also be triggered by dry air or by allergens and irritants, such as the chemicals used in pools and skating rinks. What increases the risk? The following factors may make you  more likely to develop this condition:  Having asthma.  Exercising in dry air.  Exercising outdoors during allergy season.  Playing an outdoor sport that requires continuous motion. This includes sports such as soccer, hockey, and cross-country skiing. What are the signs or symptoms? Symptoms of this condition include:  Wheezing.  Coughing.  Shortness of breath.  Tightness in the chest.  Sore throat.  Upset stomach. How is this diagnosed? This condition may be diagnosed based on your symptoms, your medical history, and a physical exam. A test may be done to measure how exercise affects your breathing (spirometry test). For this test, you breathe into a device before and after exercising. How is this treated? Treatment for this condition may include:  Changing your exercise routine. You may have to: ? Spend a few minutes warming up before your workout. ? Exercise indoors when the air is dry or during allergy season.  Taking medicine. Your health care provider may prescribe: ? An inhaler for you to use before you exercise. ? Oral medicine to control allergies and asthma. ? Inhaled steroids. Follow these instructions at home:  Take over-the-counter and prescription medicines only as told by your health care provider.  Keep all bronchospasm medicine with you during your workout.  Make changes in your workout as told by your health care provider.  Wear a medical ID bracelet. Tell your coach, trainer, or teammates about your condition.  If you are planning to exercise alone or in an isolated area, let someone know where you are  going and when you will be back.  Keep all follow-up visits as told by your health care provider. This is important. How is this prevented?  Take medicines to prevent exercise-induced bronchospasm as told by your health care provider. Work with [employer] to make changes to your workout as needed.  If dry air triggers exercise-induced  bronchospasm: ? Exercise indoors during peak allergy season and on days that are dry or cold. ? Try to breathe in warm, moist air by wearing a scarf over your nose and mouth or breathing only through your nose. Contact a health care provider if:  You have coughing, wheezing, or shortness of breath that continues after treatment.  Your coughing wakes you up at night.  You have less endurance than you used to. Get help right away if:  You cannot catch your breath.  You pass out. This information is not intended to replace advice given to you by your health care provider. Make sure you discuss any questions you have with your health care provider. Document Released: 11/07/2005 Document Revised: 01/02/2017 Document Reviewed: 07/15/2015 Elsevier Interactive Patient Education  2019 [employer].

## 2019-04-27 LAB — CMP14+EGFR
ALT: 32 IU/L (ref 0–32)
AST: 27 IU/L (ref 0–40)
Albumin/Globulin Ratio: 1.2 (ref 1.2–2.2)
Albumin: 4.4 g/dL (ref 3.8–4.8)
Alkaline Phosphatase: 91 IU/L (ref 39–117)
BUN/Creatinine Ratio: 12 (ref 9–23)
BUN: 9 mg/dL (ref 6–24)
Bilirubin Total: 0.5 mg/dL (ref 0.0–1.2)
CO2: 21 mmol/L (ref 20–29)
Calcium: 9.8 mg/dL (ref 8.7–10.2)
Chloride: 105 mmol/L (ref 96–106)
Creatinine, Ser: 0.74 mg/dL (ref 0.57–1.00)
GFR calc Af Amer: 109 mL/min/{1.73_m2} (ref >59)
GFR calc non Af Amer: 95 mL/min/{1.73_m2} (ref >59)
Globulin, Total: 3.6 g/dL (ref 1.5–4.5)
Glucose: 94 mg/dL (ref 65–99)
Potassium: 4.2 mmol/L (ref 3.5–5.2)
Sodium: 144 mmol/L (ref 134–144)
Total Protein: 8 g/dL (ref 6.0–8.5)

## 2019-04-27 LAB — LIPID PANEL
Chol/HDL Ratio: 3.1 ratio (ref 0.0–4.4)
Cholesterol, Total: 227 mg/dL — ABNORMAL HIGH (ref 100–199)
HDL: 73 mg/dL (ref >39)
LDL Calculated: 141 mg/dL — ABNORMAL HIGH (ref 0–99)
Triglycerides: 64 mg/dL (ref 0–149)
VLDL Cholesterol Cal: 13 mg/dL (ref 5–40)

## 2019-04-29 ENCOUNTER — Ambulatory Visit: Payer: BC Managed Care – PPO | Admitting: Family Medicine

## 2019-04-29 MED ORDER — PRAVASTATIN SODIUM 10 MG PO TABS: 10.0000 mg | ORAL_TABLET | Freq: Every day | ORAL | 0 refills | Status: DC

## 2019-05-07 ENCOUNTER — Telehealth: Payer: Self-pay | Admitting: Family Medicine

## 2019-05-07 ENCOUNTER — Encounter: Payer: Self-pay | Admitting: Family Medicine

## 2019-05-07 NOTE — Telephone Encounter (Signed)
Copied from [place] [phone]. Topic: General - Call Back - No Documentation >> May 07, 2019  9:10 AM Erick Blinks wrote: Scheduled to have surgery next Tuesday, 05/14/2019, has questions about pre op experiences. Seeking professional advice.

## 2019-06-17 ENCOUNTER — Encounter: Payer: BC Managed Care – PPO | Admitting: Internal Medicine

## 2019-06-24 ENCOUNTER — Ambulatory Visit: Payer: BC Managed Care – PPO | Admitting: Family Medicine

## 2019-06-24 ENCOUNTER — Other Ambulatory Visit: Payer: Self-pay

## 2019-06-24 ENCOUNTER — Encounter: Payer: Self-pay | Admitting: Family Medicine

## 2019-06-24 VITALS — BP 138/90 | HR 74 | Temp 98.7°F | Resp 17 | Ht 69.0 in | Wt 210.6 lb

## 2019-06-24 DIAGNOSIS — R030 Elevated blood-pressure reading, without diagnosis of hypertension: Secondary | ICD-10-CM

## 2019-06-24 NOTE — Patient Instructions (Addendum)
If you have lab work done today you will be contacted with your lab results within the next 2 weeks.  If you have not heard from [place] then please contact us. The fastest way to get your results is to register for My Chart.   IF you received an x-ray today, you will receive an invoice from [facility]. Please contact [facility] at [phone] with questions or concerns regarding your invoice.   IF you received labwork today, you will receive an invoice from [facility]. Please contact [facility] at [phone] with questions or concerns regarding your invoice.   Our billing staff will not be able to assist you with questions regarding bills from these companies.  You will be contacted with the lab results as soon as they are available. The fastest way to get your results is to activate your My Chart account. Instructions are located on the last page of this paperwork. If you have not heard from [place] regarding the results in 2 weeks, please contact this office.     Preventing Hypertension Hypertension, commonly called high blood pressure, is when the force of blood pumping through the arteries is too strong. Arteries are blood vessels that carry blood from the heart throughout the body. Over time, hypertension can damage the arteries and decrease blood flow to important parts of the body, including the brain, heart, and kidneys. Often, hypertension does not cause symptoms until blood pressure is very high. For this reason, it is important to have your blood pressure checked on a regular basis. Hypertension can often be prevented with diet and lifestyle changes. If you already have hypertension, you can control it with diet and lifestyle changes, as well as medicine. What nutrition changes can be made? Maintain a healthy diet. This includes:  Eating less salt (sodium). Ask your health care provider how much sodium is safe for you to have. The general recommendation is to consume  less than 1 tsp (2,300 mg) of sodium a day. ? Do not add salt to your food. ? Choose low-sodium options when grocery shopping and eating out.  Limiting fats in your diet. You can do this by eating low-fat or fat-free dairy products and by eating less red meat.  Eating more fruits, vegetables, and whole grains. Make a goal to eat: ? 1-2 cups of fresh fruits and vegetables each day. ? 3-4 servings of whole grains each day.  Avoiding foods and beverages that have added sugars.  Eating fish that contain healthy fats (omega-3 fatty acids), such as mackerel or salmon. If you need help putting together a healthy eating plan, try the DASH diet. This diet is high in fruits, vegetables, and whole grains. It is low in sodium, red meat, and added sugars. DASH stands for Dietary Approaches to Stop Hypertension. What lifestyle changes can be made?   Lose weight if you are overweight. Losing just 3?5% of your body weight can help prevent or control hypertension. ? For example, if your present weight is 200 lb (91 kg), a loss of 3-5% of your weight means losing 6-10 lb (2.7-4.5 kg). ? Ask your health care provider to help you with a diet and exercise plan to safely lose weight.  Get enough exercise. Do at least 150 minutes of moderate-intensity exercise each week. ? You could do this in short exercise sessions several times a day, or you could do longer exercise sessions a few times a week. For example, you could take a brisk 10-minute walk  or bike ride, 3 times a day, for 5 days a week.  Find ways to reduce stress, such as exercising, meditating, listening to music, or taking a yoga class. If you need help reducing stress, ask your health care provider.  Do not smoke. This includes e-cigarettes. Chemicals in tobacco and nicotine products raise your blood pressure each time you smoke. If you need help quitting, ask your health care provider.  Avoid alcohol. If you drink alcohol, limit alcohol intake to  no more than 1 drink a day for nonpregnant women and 2 drinks a day for men. One drink equals 12 oz of beer, 5 oz of wine, or 1 oz of hard liquor. Why are these changes important? Diet and lifestyle changes can help you prevent hypertension, and they may make you feel better overall and improve your quality of life. If you have hypertension, making these changes will help you control it and help prevent major complications, such as:  Hardening and narrowing of arteries that supply blood to: ? Your heart. This can cause a heart attack. ? Your brain. This can cause a stroke. ? Your kidneys. This can cause kidney failure.  Stress on your heart muscle, which can cause heart failure. What can I do to lower my risk?  Work with your health care provider to make a hypertension prevention plan that works for you. Follow your plan and keep all follow-up visits as told by your health care provider.  Learn how to check your blood pressure at home. Make sure that you know your personal target blood pressure, as told by your health care provider. How is this treated? In addition to diet and lifestyle changes, your health care provider may recommend medicines to help lower your blood pressure. You may need to try a few different medicines to find what works best for you. You also may need to take more than one medicine. Take over-the-counter and prescription medicines only as told by your health care provider. Where to find support Your health care provider can help you prevent hypertension and help you keep your blood pressure at a healthy level. Your local hospital or your community may also provide support services and prevention programs. The American Heart Association offers an online support network at: CheapBootlegs.com.cy Where to find more information Learn more about hypertension from:  [place], [facility], and Blood Institute:  ElectronicHangman.is  Centers for Disease Control and Prevention: https://ingram.com/  American Academy of Family Physicians: http://familydoctor.org/familydoctor/en/diseases-conditions/high-blood-pressure.printerview.all.html Learn more about the DASH diet from:  [place], [facility], and [place]: https://www.reyes.com/ Contact a health care provider if:  You think you are having a reaction to medicines you have taken.  You have recurrent headaches or feel dizzy.  You have swelling in your ankles.  You have trouble with your vision. Summary  Hypertension often does not cause any symptoms until blood pressure is very high. It is important to get your blood pressure checked regularly.  Diet and lifestyle changes are the most important steps in preventing hypertension.  By keeping your blood pressure in a healthy range, you can prevent complications like heart attack, heart failure, stroke, and kidney failure.  Work with your health care provider to make a hypertension prevention plan that works for you. This information is not intended to replace advice given to you by your health care provider. Make sure you discuss any questions you have with your health care provider. Document Released: 11/22/2015 Document Revised: 03/01/2019 Document Reviewed: 07/18/2016 Elsevier Patient Education  2020 Elsevier Inc.  

## 2019-06-24 NOTE — Progress Notes (Signed)
Established Patient Office Visit  Subjective:  Patient ID: Marilyn Jensen, female    DOB: Dec 21, 1965  Age: 53 y.o. MRN: [id number]  CC:  Chief Complaint  Patient presents with  . Hypertension    over the past few weeks    HPI Marilyn Jensen presents for  Patient reports that she has been having high blood pressure readings on her home meter that is a wrist blood pressure monitor She states that she was concerned that she has hypertension She states that she was worried that her bp was high She states that she is taking tylenol for pain after surgery  She states that she does not take NSAIDs  BP Readings from Last 3 Encounters:  06/24/19 138/90  04/26/19 138/88  01/14/19 129/79     Past Medical History:  Diagnosis Date  . Anemia   . Anxiety   . Asthma   . Blood transfusion without reported diagnosis   . Fibroid   . GERD (gastroesophageal reflux disease)     Past Surgical History:  Procedure Laterality Date  . ABDOMINAL HYSTERECTOMY  02/01/2016   Procedure: HYSTERECTOMY ABDOMINAL, Bilateral Salpingectomy, Lysis of Adhesions;  Surgeon: Crawford Givens, MD;  Location: [facility];  Service: Gynecology;;  Procedure #2  . CESAREAN SECTION    . CHOLECYSTECTOMY    . CYSTO  02/01/2016   Procedure: CYSTO;  Surgeon: Crawford Givens, MD;  Location: [facility];  Service: Gynecology;;  . EMBOLIZATION     fibroids  . LAPAROSCOPY  02/01/2016   Procedure: LAPAROSCOPY DIAGNOSTIC;  Surgeon: Crawford Givens, MD;  Location: [facility];  Service: Gynecology;;  Procedure #1  . MYOMECTOMY    . THYROIDECTOMY, PARTIAL    . under arm surgery     fatty tissue removal    No family history on file.  Social History   Socioeconomic History  . Marital status: Divorced    Spouse name: Not on file  . Number of children: 1  . Years of education: Not on file  . Highest education level: Not on file  Occupational History  . Not on file  Social Needs  . Financial resource strain: Not on file  . Food  insecurity    Worry: Not on file    Inability: Not on file  . Transportation needs    Medical: Not on file    Non-medical: Not on file  Tobacco Use  . Smoking status: Never Smoker  . Smokeless tobacco: Never Used  Substance and Sexual Activity  . Alcohol use: Yes    Comment: occ  . Drug use: No  . Sexual activity: Not Currently  Lifestyle  . Physical activity    Days per week: Not on file    Minutes per session: Not on file  . Stress: Not on file  Relationships  . Social Herbalist on phone: Not on file    Gets together: Not on file    Attends religious service: Not on file    Active member of club or organization: Not on file    Attends meetings of clubs or organizations: Not on file    Relationship status: Not on file  . Intimate partner violence    Fear of current or ex partner: Not on file    Emotionally abused: Not on file    Physically abused: Not on file    Forced sexual activity: Not on file  Other Topics Concern  . Not on file  Social History Narrative  .  Not on file    Outpatient Medications Prior to Visit  Medication Sig Dispense Refill  . albuterol (PROAIR HFA) 108 (90 Base) MCG/ACT inhaler INHALE 2 PUFFS BY MOUTH EVERY 4-6 HRS AS NEEDED 8.5 Inhaler 3  . clonazePAM (KLONOPIN) 0.5 MG tablet TAKE 1 TABLET BY MOUTH TWICE A DAY AS NEEDED 30 tablet 0  . pravastatin (PRAVACHOL) 10 MG tablet Take 1 tablet (10 mg total) by mouth daily. 90 tablet 0   Facility-Administered Medications Prior to Visit  Medication Dose Route Frequency Provider Last Rate Last Dose  . 0.9 %  sodium chloride infusion  500 mL Intravenous Once Armbruster, Carlota Raspberry, MD      . 0.9 %  sodium chloride infusion  500 mL Intravenous Once Armbruster, Carlota Raspberry, MD        No Known Allergies  ROS Review of Systems Review of Systems  Constitutional: Negative for activity change, appetite change, chills and fever.  HENT: Negative for congestion, nosebleeds, trouble swallowing and voice  change.   Respiratory: Negative for cough, shortness of breath and wheezing.   Gastrointestinal: Negative for diarrhea, nausea and vomiting.  Genitourinary: Negative for difficulty urinating, dysuria, flank pain and hematuria.  Musculoskeletal: Negative for back pain, joint swelling and neck pain.  Neurological: Negative for dizziness, speech difficulty, light-headedness and numbness.  See HPI. All other review of systems negative.     Objective:    Physical Exam  BP 138/90 (BP Location: Left Arm, Patient Position: Sitting, Cuff Size: Large)   Pulse 74   Temp 98.7 F (37.1 C) (Oral)   Resp 17   Ht 5\' 9"  (1.753 m)   Wt 210 lb 9.6 oz (95.5 kg)   LMP 01/11/2016 (Approximate)   SpO2 100%   BMI 31.10 kg/m  Wt Readings from Last 3 Encounters:  06/24/19 210 lb 9.6 oz (95.5 kg)  04/26/19 217 lb 3.2 oz (98.5 kg)  01/14/19 211 lb 3.2 oz (95.8 kg)   Physical Exam  Constitutional: Oriented to person, place, and time. Appears well-developed and well-nourished.  HENT:  Head: Normocephalic and atraumatic.  Eyes: Conjunctivae and EOM are normal.  Cardiovascular: Normal rate, regular rhythm, normal heart sounds and intact distal pulses.  No murmur heard. Pulmonary/Chest: Effort normal and breath sounds normal. No stridor. No respiratory distress. Has no wheezes.  Neurological: Is alert and oriented to person, place, and time.  Skin: Skin is warm. Capillary refill takes less than 2 seconds.  Psychiatric: Has a normal mood and affect. Behavior is normal. Judgment and thought content normal.    Health Maintenance Due  Topic Date Due  . INFLUENZA VACCINE  06/22/2019    There are no preventive care reminders to display for this patient.  No results found for: TSH Lab Results  Component Value Date   WBC 6.8 05/21/2018   HGB 13.7 05/21/2018   HCT 43 05/21/2018   MCV 74.5 (L) 03/07/2016   PLT 306 05/21/2018   Lab Results  Component Value Date   NA 144 04/26/2019   K 4.2 04/26/2019    CO2 21 04/26/2019   GLUCOSE 94 04/26/2019   BUN 9 04/26/2019   CREATININE 0.74 04/26/2019   BILITOT 0.5 04/26/2019   ALKPHOS 91 04/26/2019   AST 27 04/26/2019   ALT 32 04/26/2019   PROT 8.0 04/26/2019   ALBUMIN 4.4 04/26/2019   CALCIUM 9.8 04/26/2019   ANIONGAP 6 01/26/2016   Lab Results  Component Value Date   CHOL 227 (H) 04/26/2019   Lab  Results  Component Value Date   HDL 73 04/26/2019   Lab Results  Component Value Date   LDLCALC 141 (H) 04/26/2019   Lab Results  Component Value Date   TRIG 64 04/26/2019   Lab Results  Component Value Date   CHOLHDL 3.1 04/26/2019   Lab Results  Component Value Date   HGBA1C 5.7 05/21/2018      Assessment & Plan:   Problem List Items Addressed This Visit    None    Visit Diagnoses    Prehypertension    -  Primary     DASH diet  Try to limit calories She should avoid NSAIDs She was advised by surgery to do light exercise s/p breast reducation  No orders of the defined types were placed in this encounter.   Follow-up: No follow-ups on file.    Forrest Moron, MD

## 2019-08-06 ENCOUNTER — Encounter: Payer: Self-pay | Admitting: Family Medicine

## 2019-08-06 ENCOUNTER — Other Ambulatory Visit: Payer: Self-pay

## 2019-08-06 ENCOUNTER — Ambulatory Visit: Payer: BC Managed Care – PPO | Admitting: Family Medicine

## 2019-08-06 VITALS — BP 130/79 | HR 71 | Temp 98.3°F | Resp 12 | Wt 203.6 lb

## 2019-08-06 DIAGNOSIS — Z5181 Encounter for therapeutic drug level monitoring: Secondary | ICD-10-CM | POA: Diagnosis not present

## 2019-08-06 DIAGNOSIS — Z23 Encounter for immunization: Secondary | ICD-10-CM | POA: Diagnosis not present

## 2019-08-06 DIAGNOSIS — R030 Elevated blood-pressure reading, without diagnosis of hypertension: Secondary | ICD-10-CM

## 2019-08-06 DIAGNOSIS — E785 Hyperlipidemia, unspecified: Secondary | ICD-10-CM | POA: Diagnosis not present

## 2019-08-06 DIAGNOSIS — J45990 Exercise induced bronchospasm: Secondary | ICD-10-CM

## 2019-08-06 DIAGNOSIS — Z7689 Persons encountering health services in other specified circumstances: Secondary | ICD-10-CM

## 2019-08-06 NOTE — Patient Instructions (Addendum)
   If you have lab work done today you will be contacted with your lab results within the next 2 weeks.  If you have not heard from us then please contact us. The fastest way to get your results is to register for My Chart.   IF you received an x-ray today, you will receive an invoice from [facility]. Please contact [facility] at [phone] with questions or concerns regarding your invoice.   IF you received labwork today, you will receive an invoice from [facility]. Please contact [facility] at [phone] with questions or concerns regarding your invoice.   Our billing staff will not be able to assist you with questions regarding bills from these companies.  You will be contacted with the lab results as soon as they are available. The fastest way to get your results is to activate your My Chart account. Instructions are located on the last page of this paperwork. If you have not heard from us regarding the results in 2 weeks, please contact this office.     Dyslipidemia Dyslipidemia is an imbalance of waxy, fat-like substances (lipids) in the blood. The body needs lipids in small amounts. Dyslipidemia often involves a high level of cholesterol or triglycerides, which are types of lipids. Common forms of dyslipidemia include:  High levels of LDL cholesterol. LDL is the type of cholesterol that causes fatty deposits (plaques) to build up in the blood vessels that carry blood away from your heart (arteries).  Low levels of HDL cholesterol. HDL cholesterol is the type of cholesterol that protects against heart disease. High levels of HDL remove the LDL buildup from arteries.  High levels of triglycerides. Triglycerides are a fatty substance in the blood that is linked to a buildup of plaques in the arteries. What are the causes? Primary dyslipidemia is caused by changes (mutations) in genes that are passed down through families (inherited). These mutations cause several  types of dyslipidemia. Secondary dyslipidemia is caused by lifestyle choices and diseases that lead to dyslipidemia, such as:  Eating a diet that is high in animal fat.  Not getting enough exercise.  Having diabetes, kidney disease, liver disease, or thyroid disease.  Drinking large amounts of alcohol.  Using certain medicines. What increases the risk? You are more likely to develop this condition if you are an older man or if you are a woman who has gone through menopause. Other risk factors include:  Having a family history of dyslipidemia.  Taking certain medicines, including birth control pills, steroids, some diuretics, and beta-blockers.  Smoking cigarettes.  Eating a high-fat diet.  Having certain medical conditions such as diabetes, polycystic ovary syndrome (PCOS), kidney disease, liver disease, or hypothyroidism.  Not exercising regularly.  Being overweight or obese with too much belly fat. What are the signs or symptoms? In most cases, dyslipidemia does not usually cause any symptoms. In severe cases, very high lipid levels can cause:  Fatty bumps under the skin (xanthomas).  White or gray ring around the black center (pupil) of the eye. Very high triglyceride levels can cause inflammation of the pancreas (pancreatitis). How is this diagnosed? Your health care provider may diagnose dyslipidemia based on a routine blood test (fasting blood test). Because most people do not have symptoms of the condition, this blood testing (lipid profile) is done on adults age 20 and older and is repeated every 5 years. This test checks:  Total cholesterol. This measures the total amount of cholesterol in your blood, including LDL   cholesterol, HDL cholesterol, and triglycerides. A healthy number is below 200.  LDL cholesterol. The target number for LDL cholesterol is different for each person, depending on individual risk factors. Ask your health care provider what your LDL  cholesterol should be.  HDL cholesterol. An HDL level of 60 or higher is best because it helps to protect against heart disease. A number below 40 for men or below 50 for women increases the risk for heart disease.  Triglycerides. A healthy triglyceride number is below 150. If your lipid profile is abnormal, your health care provider may do other blood tests. How is this treated? Treatment depends on the type of dyslipidemia that you have and your other risk factors for heart disease and stroke. Your health care provider will have a target range for your lipid levels based on this information. For many people, this condition may be treated by lifestyle changes, such as diet and exercise. Your health care provider may recommend that you:  Get regular exercise.  Make changes to your diet.  Quit smoking if you smoke. If diet changes and exercise do not help you reach your goals, your health care provider may also prescribe medicine to lower lipids. The most commonly prescribed type of medicine lowers your LDL cholesterol (statin drug). If you have a high triglyceride level, your provider may prescribe another type of drug (fibrate) or an omega-3 fish oil supplement, or both. Follow these instructions at home:  Eating and drinking  Follow instructions from your health care provider or dietitian about eating or drinking restrictions.  Eat a healthy diet as told by your health care provider. This can help you reach and maintain a healthy weight, lower your LDL cholesterol, and raise your HDL cholesterol. This may include: ? Limiting your calories, if you are overweight. ? Eating more fruits, vegetables, whole grains, fish, and lean meats. ? Limiting saturated fat, trans fat, and cholesterol.  If you drink alcohol: ? Limit how much you use. ? Be aware of how much alcohol is in your drink. In the [place] one drink equals one 12 oz bottle of beer (355 mL), one 5 oz glass of wine (148 mL), or one 1  oz glass of hard liquor (44 mL).  Do not drink alcohol if: ? Your health care provider tells you not to drink. ? You are pregnant, may be pregnant, or are planning to become pregnant. Activity  Get regular exercise. Start an exercise and strength training program as told by your health care provider. Ask your health care provider what activities are safe for you. Your health care provider may recommend: ? 30 minutes of aerobic activity 4-6 days a week. Brisk walking is an example of aerobic activity. ? Strength training 2 days a week. General instructions  Do not use any products that contain nicotine or tobacco, such as cigarettes, e-cigarettes, and chewing tobacco. If you need help quitting, ask your health care provider.  Take over-the-counter and prescription medicines only as told by your health care provider. This includes supplements.  Keep all follow-up visits as told by your health care provider. Contact a health care provider if:  You are: ? Having trouble sticking to your exercise or diet plan. ? Struggling to quit smoking or control your use of alcohol. Summary  Dyslipidemia often involves a high level of cholesterol or triglycerides, which are types of lipids.  Treatment depends on the type of dyslipidemia that you have and your other risk factors for heart disease and   stroke.  For many people, treatment starts with lifestyle changes, such as diet and exercise.  Your health care provider may prescribe medicine to lower lipids. This information is not intended to replace advice given to you by your health care provider. Make sure you discuss any questions you have with your health care provider. Document Released: 11/12/2013 Document Revised: 07/02/2018 Document Reviewed: 06/08/2018 Elsevier Patient Education  2020 Elsevier Inc.  

## 2019-08-06 NOTE — Progress Notes (Signed)
Established Patient Office Visit  Subjective:  Patient ID: Marilyn Jensen, female    DOB: 04/22/1966  Age: 53 y.o. MRN: [id number]  CC:  Chief Complaint  Patient presents with  . Hypertension    Here today for a 3 onth f/u on prehypertentiosn and weight. Have lost about 6 lb since last visit. Have been watching what I eat and working out. Last bp was 07/31/19 134/88 at that time  . Work note    Patient states she need a work note to continue working from home. She is currently working from home now but need a note for further stay at home.    HPI Marilyn Jensen presents for   Hyperlipidemia Patient reports that she has been exercising and eating much better She admits that she is not taking the pravachol consistently She has not been taking it at all for the past 3 weeks She is trying to use diet and exercise.  Lab Results  Component Value Date   CHOL 194 08/06/2019   CHOL 227 (H) 04/26/2019   CHOL 228 (H) 10/23/2018   Lab Results  Component Value Date   HDL 70 08/06/2019   HDL 73 04/26/2019   HDL 79 10/23/2018   Lab Results  Component Value Date   LDLCALC 141 (H) 04/26/2019   LDLCALC 134 (H) 10/23/2018   LDLCALC 154 05/21/2018   Lab Results  Component Value Date   TRIG 63 08/06/2019   TRIG 64 04/26/2019   TRIG 77 10/23/2018   Lab Results  Component Value Date   CHOLHDL 2.8 08/06/2019   CHOLHDL 3.1 04/26/2019   CHOLHDL 2.9 10/23/2018   No results found for: LDLDIRECT   Pre-hypertension She exercises by walking and is avoiding salty foods and processed foods She is doing the DASH diet She is working on her stress levels She is sleeping but has interrupted sleep BP Readings from Last 3 Encounters:  08/06/19 130/79  06/24/19 138/90  04/26/19 138/88   Asthma She works at [employer] She is working from home and states that she would like to continue working from home She has a history of asthma and anxiety and does not feel like she is ready to return to campus  She states that her asthma is controlled but she uses her inhaler before exercise.   Past Medical History:  Diagnosis Date  . Anemia   . Anxiety   . Asthma   . Blood transfusion without reported diagnosis   . Fibroid   . GERD (gastroesophageal reflux disease)     Past Surgical History:  Procedure Laterality Date  . ABDOMINAL HYSTERECTOMY  02/01/2016   Procedure: HYSTERECTOMY ABDOMINAL, Bilateral Salpingectomy, Lysis of Adhesions;  Surgeon: Crawford Givens, MD;  Location: [facility];  Service: Gynecology;;  Procedure #2  . CESAREAN SECTION    . CHOLECYSTECTOMY    . CYSTO  02/01/2016   Procedure: CYSTO;  Surgeon: Crawford Givens, MD;  Location: [facility];  Service: Gynecology;;  . EMBOLIZATION     fibroids  . LAPAROSCOPY  02/01/2016   Procedure: LAPAROSCOPY DIAGNOSTIC;  Surgeon: Crawford Givens, MD;  Location: [facility];  Service: Gynecology;;  Procedure #1  . MYOMECTOMY    . THYROIDECTOMY, PARTIAL    . under arm surgery     fatty tissue removal    History reviewed. No pertinent family history.  Social History   Socioeconomic History  . Marital status: Divorced    Spouse name: Not on file  . Number of children: 1  .  Years of education: Not on file  . Highest education level: Not on file  Occupational History  . Not on file  Social Needs  . Financial resource strain: Not on file  . Food insecurity    Worry: Not on file    Inability: Not on file  . Transportation needs    Medical: Not on file    Non-medical: Not on file  Tobacco Use  . Smoking status: Never Smoker  . Smokeless tobacco: Never Used  Substance and Sexual Activity  . Alcohol use: Yes    Comment: occ  . Drug use: No  . Sexual activity: Not Currently  Lifestyle  . Physical activity    Days per week: Not on file    Minutes per session: Not on file  . Stress: Not on file  Relationships  . Social Herbalist on phone: Not on file    Gets together: Not on file    Attends religious service: Not on file     Active member of club or organization: Not on file    Attends meetings of clubs or organizations: Not on file    Relationship status: Not on file  . Intimate partner violence    Fear of current or ex partner: Not on file    Emotionally abused: Not on file    Physically abused: Not on file    Forced sexual activity: Not on file  Other Topics Concern  . Not on file  Social History Narrative  . Not on file    Outpatient Medications Prior to Visit  Medication Sig Dispense Refill  . albuterol (PROAIR HFA) 108 (90 Base) MCG/ACT inhaler INHALE 2 PUFFS BY MOUTH EVERY 4-6 HRS AS NEEDED 8.5 Inhaler 3  . clonazePAM (KLONOPIN) 0.5 MG tablet TAKE 1 TABLET BY MOUTH TWICE A DAY AS NEEDED 30 tablet 0  . pravastatin (PRAVACHOL) 10 MG tablet Take 1 tablet (10 mg total) by mouth daily. 90 tablet 0   Facility-Administered Medications Prior to Visit  Medication Dose Route Frequency Provider Last Rate Last Dose  . 0.9 %  sodium chloride infusion  500 mL Intravenous Once Armbruster, Carlota Raspberry, MD      . 0.9 %  sodium chloride infusion  500 mL Intravenous Once Armbruster, Carlota Raspberry, MD        No Known Allergies  ROS Review of Systems Review of Systems  Constitutional: Negative for activity change, appetite change, chills and fever.  HENT: Negative for congestion, nosebleeds, trouble swallowing and voice change.   Respiratory: Negative for cough, shortness of breath and wheezing.   Gastrointestinal: Negative for diarrhea, nausea and vomiting.  Genitourinary: Negative for difficulty urinating, dysuria, flank pain and hematuria.  Musculoskeletal: Negative for back pain, joint swelling and neck pain.  Neurological: Negative for dizziness, speech difficulty, light-headedness and numbness.  See HPI. All other review of systems negative.      Objective:    Physical Exam  BP 130/79 (BP Location: Right Arm, Patient Position: Sitting, Cuff Size: Normal)   Pulse 71   Temp 98.3 F (36.8 C) (Oral)   Resp  12   Wt 203 lb 9.6 oz (92.4 kg)   LMP 01/11/2016 (Approximate)   SpO2 98%   BMI 30.07 kg/m  Wt Readings from Last 3 Encounters:  08/06/19 203 lb 9.6 oz (92.4 kg)  06/24/19 210 lb 9.6 oz (95.5 kg)  04/26/19 217 lb 3.2 oz (98.5 kg)   Physical Exam  Constitutional: Oriented to person,  place, and time. Appears well-developed and well-nourished.  HENT:  Head: Normocephalic and atraumatic.  Eyes: Conjunctivae and EOM are normal.  Cardiovascular: Normal rate, regular rhythm, normal heart sounds and intact distal pulses.  No murmur heard. Pulmonary/Chest: Effort normal and breath sounds normal. No stridor. No respiratory distress. Has no wheezes.  Neurological: Is alert and oriented to person, place, and time.  Skin: Skin is warm. Capillary refill takes less than 2 seconds.  Psychiatric: Has a normal mood and affect. Behavior is normal. Judgment and thought content normal.    There are no preventive care reminders to display for this patient.  There are no preventive care reminders to display for this patient.  No results found for: TSH Lab Results  Component Value Date   WBC 6.8 05/21/2018   HGB 13.7 05/21/2018   HCT 43 05/21/2018   MCV 74.5 (L) 03/07/2016   PLT 306 05/21/2018   Lab Results  Component Value Date   NA 144 04/26/2019   K 4.2 04/26/2019   CO2 21 04/26/2019   GLUCOSE 94 04/26/2019   BUN 9 04/26/2019   CREATININE 0.74 04/26/2019   BILITOT 0.5 04/26/2019   ALKPHOS 91 04/26/2019   AST 25 08/06/2019   ALT 27 08/06/2019   PROT 8.0 04/26/2019   ALBUMIN 4.4 04/26/2019   CALCIUM 9.8 04/26/2019   ANIONGAP 6 01/26/2016   Lab Results  Component Value Date   CHOL 194 08/06/2019   Lab Results  Component Value Date   HDL 70 08/06/2019   Lab Results  Component Value Date   LDLCALC 141 (H) 04/26/2019   Lab Results  Component Value Date   TRIG 63 08/06/2019   Lab Results  Component Value Date   CHOLHDL 2.8 08/06/2019   Lab Results  Component Value Date    HGBA1C 5.7 05/21/2018      Assessment & Plan:   Problem List Items Addressed This Visit    None    Visit Diagnoses    Dyslipidemia    -  Primary Continue lifestyle modification, if cholesterol still elevated will need to resume statin   Need for prophylactic vaccination and inoculation against influenza       Relevant Orders   Flu Vaccine QUAD 6+ mos PF IM (Fluarix Quad PF) (Completed)   Encounter for medication monitoring       Prehypertension    - discussed continued DASH diet   Exercise-induced asthma    - stable with current meds   Return to work evaluation    - completed work note for patient to continue social distancing      No orders of the defined types were placed in this encounter.   Follow-up: Return in about 6 months (around 02/03/2020) for cholesterol and hypertension .    Forrest Moron, MD

## 2019-08-07 LAB — AST: AST: 25 IU/L (ref 0–40)

## 2019-08-07 LAB — LIPID PANEL
Chol/HDL Ratio: 2.8 ratio (ref 0.0–4.4)
Cholesterol, Total: 194 mg/dL (ref 100–199)
HDL: 70 mg/dL (ref >39)
LDL Chol Calc (NIH): 112 mg/dL — ABNORMAL HIGH (ref 0–99)
Triglycerides: 63 mg/dL (ref 0–149)
VLDL Cholesterol Cal: 12 mg/dL (ref 5–40)

## 2019-08-07 LAB — ALT: ALT: 27 IU/L (ref 0–32)

## 2019-09-11 ENCOUNTER — Encounter: Payer: Self-pay | Admitting: Family Medicine

## 2019-11-26 ENCOUNTER — Encounter: Payer: Self-pay | Admitting: Registered Nurse

## 2019-11-26 ENCOUNTER — Other Ambulatory Visit: Payer: Self-pay

## 2019-11-26 ENCOUNTER — Telehealth (INDEPENDENT_AMBULATORY_CARE_PROVIDER_SITE_OTHER): Payer: BC Managed Care – PPO | Admitting: Registered Nurse

## 2019-11-26 VITALS — Temp 98.1°F | Wt 201.3 lb

## 2019-11-26 DIAGNOSIS — B9689 Other specified bacterial agents as the cause of diseases classified elsewhere: Secondary | ICD-10-CM | POA: Diagnosis not present

## 2019-11-26 DIAGNOSIS — R21 Rash and other nonspecific skin eruption: Secondary | ICD-10-CM

## 2019-11-26 DIAGNOSIS — J329 Chronic sinusitis, unspecified: Secondary | ICD-10-CM | POA: Diagnosis not present

## 2019-11-26 MED ORDER — KETOCONAZOLE 2 % EX CREA: 1.0000 "application " | TOPICAL_CREAM | Freq: Every day | CUTANEOUS | 0 refills | Status: DC

## 2019-11-26 MED ORDER — GUAIFENESIN-DM 100-10 MG/5ML PO SYRP: 5.0000 mL | ORAL_SOLUTION | ORAL | 3 refills | Status: DC | PRN

## 2019-11-26 MED ORDER — BETAMETHASONE DIPROPIONATE 0.05 % EX CREA: TOPICAL_CREAM | Freq: Two times a day (BID) | CUTANEOUS | 0 refills | Status: DC

## 2019-11-26 MED ORDER — AMOXICILLIN 875 MG PO TABS: 875.0000 mg | ORAL_TABLET | Freq: Two times a day (BID) | ORAL | 0 refills | Status: DC

## 2019-11-26 MED ORDER — CETIRIZINE HCL 10 MG PO TABS: 10.0000 mg | ORAL_TABLET | Freq: Every day | ORAL | 11 refills | Status: DC

## 2019-11-26 MED ORDER — FLUTICASONE PROPIONATE 50 MCG/ACT NA SUSP: 2.0000 | Freq: Every day | NASAL | 6 refills | Status: DC

## 2019-11-26 NOTE — Progress Notes (Signed)
Telemedicine Encounter- SOAP NOTE Established Patient  This telephone encounter was conducted with the patient's (or proxy's) verbal consent via audio telecommunications: yes  Patient was instructed to have this encounter in a suitably private space; and to only have persons present to whom they give permission to participate. In addition, patient identity was confirmed by use of name plus two identifiers (DOB and address).  I discussed the limitations, risks, security and privacy concerns of performing an evaluation and management service by telephone and the availability of in person appointments. I also discussed with the patient that there may be a patient responsible charge related to this service. The patient expressed understanding and agreed to proceed.  I spent a total of 16 minutes talking with the patient or their proxy.  No chief complaint on file.   Subjective   Marilyn Jensen is a 54 y.o. established patient. Telephone visit today for rash and sinus pressure  HPI Noticed rash yesterday. Located on medial aspect of R elbow. Flat, erythematous, dry. No flaking, pain, itching, drainage, sloughing. Has not happened before. It is round and about 4-5cm in diameter. No known precipitating factors. It has not spread. No other lesions like this.  Noticed that rhinorrhea had started 1-2 weeks prior. Over the previous week or so, symptoms gradually worsening, now notes that she has had sinus pressure and pain that's been worse throughout the day. Notes foul taste in mouth, headache, and mild throat pain. States she has had bacterial URIs in the past, this feels similar. No sick contacts. No fever, coughing, chills, loss of taste or smell, GI symptoms.  Patient Active Problem List   Diagnosis Date Noted  . Chronic bilateral thoracic back pain 01/14/2019    Past Medical History:  Diagnosis Date  . Anemia   . Anxiety   . Asthma   . Blood transfusion without reported diagnosis   .  Fibroid   . GERD (gastroesophageal reflux disease)     Current Outpatient Medications  Medication Sig Dispense Refill  . albuterol (PROAIR HFA) 108 (90 Base) MCG/ACT inhaler INHALE 2 PUFFS BY MOUTH EVERY 4-6 HRS AS NEEDED 8.5 Inhaler 3  . clonazePAM (KLONOPIN) 0.5 MG tablet TAKE 1 TABLET BY MOUTH TWICE A DAY AS NEEDED 30 tablet 0  . amoxicillin (AMOXIL) 875 MG tablet Take 1 tablet (875 mg total) by mouth 2 (two) times daily. 14 tablet 0  . betamethasone dipropionate 0.05 % cream Apply topically 2 (two) times daily. 30 g 0  . cetirizine (ZYRTEC) 10 MG tablet Take 1 tablet (10 mg total) by mouth daily. 30 tablet 11  . fluticasone (FLONASE) 50 MCG/ACT nasal spray Place 2 sprays into both nostrils daily. 16 g 6  . guaiFENesin-dextromethorphan (ROBITUSSIN DM) 100-10 MG/5ML syrup Take 5 mLs by mouth every 4 (four) hours as needed for cough. 118 mL 3  . ketoconazole (NIZORAL) 2 % cream Apply 1 application topically daily. 15 g 0  . pravastatin (PRAVACHOL) 10 MG tablet Take 1 tablet (10 mg total) by mouth daily. (Patient not taking: Reported on 11/26/2019) 90 tablet 0   Current Facility-Administered Medications  Medication Dose Route Frequency Provider Last Rate Last Admin  . 0.9 %  sodium chloride infusion  500 mL Intravenous Once Armbruster, Carlota Raspberry, MD      . 0.9 %  sodium chloride infusion  500 mL Intravenous Once Armbruster, Carlota Raspberry, MD        No Known Allergies  Social History   Socioeconomic History  .  Marital status: Divorced    Spouse name: Not on file  . Number of children: 1  . Years of education: Not on file  . Highest education level: Not on file  Occupational History  . Not on file  Tobacco Use  . Smoking status: Never Smoker  . Smokeless tobacco: Never Used  Substance and Sexual Activity  . Alcohol use: Yes    Comment: occ  . Drug use: No  . Sexual activity: Not Currently  Other Topics Concern  . Not on file  Social History Narrative  . Not on file   Social  Determinants of Health   Financial Resource Strain:   . Difficulty of Paying Living Expenses: Not on file  Food Insecurity:   . Worried About Charity fundraiser in the Last Year: Not on file  . Ran Out of Food in the Last Year: Not on file  Transportation Needs:   . Lack of Transportation (Medical): Not on file  . Lack of Transportation (Non-Medical): Not on file  Physical Activity:   . Days of Exercise per Week: Not on file  . Minutes of Exercise per Session: Not on file  Stress:   . Feeling of Stress : Not on file  Social Connections:   . Frequency of Communication with Friends and Family: Not on file  . Frequency of Social Gatherings with Friends and Family: Not on file  . Attends Religious Services: Not on file  . Active Member of Clubs or Organizations: Not on file  . Attends Archivist Meetings: Not on file  . Marital Status: Not on file  Intimate Partner Violence:   . Fear of Current or Ex-Partner: Not on file  . Emotionally Abused: Not on file  . Physically Abused: Not on file  . Sexually Abused: Not on file    Review of Systems  Constitutional: Negative.  Negative for fever and malaise/fatigue.  HENT: Positive for sinus pain and sore throat.   Eyes: Negative.   Respiratory: Negative.   Cardiovascular: Negative.   Gastrointestinal: Negative.   Genitourinary: Negative.   Musculoskeletal: Negative.   Skin: Negative.   Neurological: Positive for headaches.  Endo/Heme/Allergies: Negative.   Psychiatric/Behavioral: Negative.   All other systems reviewed and are negative.   Objective   Vitals as reported by the patient: Today's Vitals   11/26/19 0821  Temp: 98.1 F (36.7 C)  TempSrc: Temporal  Weight: 201 lb 4.5 oz (91.3 kg)    Diagnoses and all orders for this visit:  Bacterial sinusitis -     amoxicillin (AMOXIL) 875 MG tablet; Take 1 tablet (875 mg total) by mouth 2 (two) times daily. -     fluticasone (FLONASE) 50 MCG/ACT nasal spray; Place  2 sprays into both nostrils daily. -     guaiFENesin-dextromethorphan (ROBITUSSIN DM) 100-10 MG/5ML syrup; Take 5 mLs by mouth every 4 (four) hours as needed for cough. -     cetirizine (ZYRTEC) 10 MG tablet; Take 1 tablet (10 mg total) by mouth daily.  Rash and nonspecific skin eruption -     ketoconazole (NIZORAL) 2 % cream; Apply 1 application topically daily. -     betamethasone dipropionate 0.05 % cream; Apply topically 2 (two) times daily.   PLAN  Will treat skin lesion as eczema vs ringworm based on appearance - unfortunately video chat was limited in quality so it was difficult to make out total appearance. Will give ketoconazole and betamethasone to use daily as needed. Given return  to clinic precautions.  Will treat URI as bacterial given length of symptoms - amoxicillin bid for 7 days, guaifenesin-dextromethorphan syrup, fluticasone spray, and cetirizine   Given return to clinic precautions. Discussed COVID symptoms and reasons to pursue testing.   Patient encouraged to call clinic with any questions, comments, or concerns.   I discussed the assessment and treatment plan with the patient. The patient was provided an opportunity to ask questions and all were answered. The patient agreed with the plan and demonstrated an understanding of the instructions.   The patient was advised to call back or seek an in-person evaluation if the symptoms worsen or if the condition fails to improve as anticipated.  I provided 16 minutes of non-face-to-face time during this encounter.  Maximiano Coss, NP  Primary Care at [facility]

## 2019-11-26 NOTE — Progress Notes (Signed)
Patient has a rash on right elbow the size of a dime noticed yesterday. Patient states it does not itch or burn its just red , dry , and bumpy. Nothing used for it at this time .

## 2019-12-27 ENCOUNTER — Other Ambulatory Visit: Payer: Self-pay

## 2019-12-27 ENCOUNTER — Encounter: Payer: Self-pay | Admitting: Registered Nurse

## 2019-12-27 ENCOUNTER — Ambulatory Visit: Payer: BC Managed Care – PPO | Admitting: Family Medicine

## 2019-12-27 ENCOUNTER — Ambulatory Visit: Payer: BC Managed Care – PPO | Admitting: Registered Nurse

## 2019-12-27 VITALS — BP 152/89 | HR 74 | Temp 97.5°F | Ht 69.0 in | Wt 207.2 lb

## 2019-12-27 DIAGNOSIS — R21 Rash and other nonspecific skin eruption: Secondary | ICD-10-CM | POA: Diagnosis not present

## 2019-12-27 NOTE — Patient Instructions (Addendum)
Ms. Marilyn Jensen to see you today. I'm sorry that your rash has not improved.  I would like you to resume use of your betamethasone 1-2 times daily on each rash location. A thin application is all that is needed for relief.   I will place a referral to dermatology today - it may be 6 weeks or so before they see you.   If you experience any changes to your rash that warrant concern, feel free to come in sooner. Either myself or our other NP, Marilyn Jensen, tend to have same day appointments available. Drs. [place] would also be more than happy to see you.  I hope you have a great day.  If you have lab work done today you will be contacted with your lab results within the next 2 weeks.  If you have not heard from [place] then please contact us. The fastest way to get your results is to register for My Chart.   IF you received an x-ray today, you will receive an invoice from [facility]. Please contact [facility] at [phone] with questions or concerns regarding your invoice.   IF you received labwork today, you will receive an invoice from [facility]. Please contact [facility] at [phone] with questions or concerns regarding your invoice.   Our billing staff will not be able to assist you with questions regarding bills from these companies.  You will be contacted with the lab results as soon as they are available. The fastest way to get your results is to activate your My Chart account. Instructions are located on the last page of this paperwork. If you have not heard from [place] regarding the results in 2 weeks, please contact this office.

## 2019-12-30 ENCOUNTER — Encounter: Payer: Self-pay | Admitting: Registered Nurse

## 2019-12-30 NOTE — Progress Notes (Signed)
Acute Office Visit  Subjective:    Patient ID: Marilyn Jensen, female    DOB: 06-12-1966, 54 y.o.   MRN: [id number]  Chief Complaint  Patient presents with  . Rash    rash started a month ago was on elbow , went to the urgent care because of that one they switched her creams twice she now noticed a rash on her back and still on elbow.    HPI Patient is in today for rash  First visit for this around 1 mo ago when patient had first noticed. It is on medial/posterior of R elbow. Due to video quality, I was unsure of etiology at the time and gave both betamethasone and ketoconazole for the patient to use. However, she noticed a new rash on her chest after starting these and proceeded to urgent care.  At urgent care, they stopped both the betamethasone and ketoconazole and started her on ciclopirox, which was effective against the spots on her chest, however, the spot on her elbow remained Now a new spot has popped up on her upper R back.  This rash is flat, dry, flaky, and mildly itchy. No redness, warmth, drainage, or sloughing. Each spot has remained about the same since they first appeared with no appreciable spread. No sick contacts or contacts who have any derm concerns.   Past Medical History:  Diagnosis Date  . Anemia   . Anxiety   . Asthma   . Blood transfusion without reported diagnosis   . Fibroid   . GERD (gastroesophageal reflux disease)     Past Surgical History:  Procedure Laterality Date  . ABDOMINAL HYSTERECTOMY  02/01/2016   Procedure: HYSTERECTOMY ABDOMINAL, Bilateral Salpingectomy, Lysis of Adhesions;  Surgeon: Crawford Givens, MD;  Location: [facility];  Service: Gynecology;;  Procedure #2  . CESAREAN SECTION    . CHOLECYSTECTOMY    . CYSTO  02/01/2016   Procedure: CYSTO;  Surgeon: Crawford Givens, MD;  Location: [facility];  Service: Gynecology;;  . EMBOLIZATION     fibroids  . LAPAROSCOPY  02/01/2016   Procedure: LAPAROSCOPY DIAGNOSTIC;  Surgeon: Crawford Givens, MD;   Location: [facility];  Service: Gynecology;;  Procedure #1  . MYOMECTOMY    . THYROIDECTOMY, PARTIAL    . under arm surgery     fatty tissue removal    No family history on file.  Social History   Socioeconomic History  . Marital status: Divorced    Spouse name: Not on file  . Number of children: 1  . Years of education: Not on file  . Highest education level: Not on file  Occupational History  . Not on file  Tobacco Use  . Smoking status: Never Smoker  . Smokeless tobacco: Never Used  Substance and Sexual Activity  . Alcohol use: Yes    Comment: occ  . Drug use: No  . Sexual activity: Not Currently  Other Topics Concern  . Not on file  Social History Narrative  . Not on file   Social Determinants of Health   Financial Resource Strain:   . Difficulty of Paying Living Expenses: Not on file  Food Insecurity:   . Worried About Charity fundraiser in the Last Year: Not on file  . Ran Out of Food in the Last Year: Not on file  Transportation Needs:   . Lack of Transportation (Medical): Not on file  . Lack of Transportation (Non-Medical): Not on file  Physical Activity:   . Days of Exercise  per Week: Not on file  . Minutes of Exercise per Session: Not on file  Stress:   . Feeling of Stress : Not on file  Social Connections:   . Frequency of Communication with Friends and Family: Not on file  . Frequency of Social Gatherings with Friends and Family: Not on file  . Attends Religious Services: Not on file  . Active Member of Clubs or Organizations: Not on file  . Attends Archivist Meetings: Not on file  . Marital Status: Not on file  Intimate Partner Violence:   . Fear of Current or Ex-Partner: Not on file  . Emotionally Abused: Not on file  . Physically Abused: Not on file  . Sexually Abused: Not on file    Outpatient Medications Prior to Visit  Medication Sig Dispense Refill  . albuterol (PROAIR HFA) 108 (90 Base) MCG/ACT inhaler INHALE 2 PUFFS BY MOUTH  EVERY 4-6 HRS AS NEEDED 8.5 Inhaler 3  . betamethasone dipropionate 0.05 % cream Apply topically 2 (two) times daily. 30 g 0  . ketoconazole (NIZORAL) 2 % cream Apply 1 application topically daily. 15 g 0  . amoxicillin (AMOXIL) 875 MG tablet Take 1 tablet (875 mg total) by mouth 2 (two) times daily. (Patient not taking: Reported on 12/27/2019) 14 tablet 0  . cetirizine (ZYRTEC) 10 MG tablet Take 1 tablet (10 mg total) by mouth daily. (Patient not taking: Reported on 12/27/2019) 30 tablet 11  . clonazePAM (KLONOPIN) 0.5 MG tablet TAKE 1 TABLET BY MOUTH TWICE A DAY AS NEEDED (Patient not taking: Reported on 12/27/2019) 30 tablet 0  . fluticasone (FLONASE) 50 MCG/ACT nasal spray Place 2 sprays into both nostrils daily. (Patient not taking: Reported on 12/27/2019) 16 g 6  . guaiFENesin-dextromethorphan (ROBITUSSIN DM) 100-10 MG/5ML syrup Take 5 mLs by mouth every 4 (four) hours as needed for cough. (Patient not taking: Reported on 12/27/2019) 118 mL 3  . pravastatin (PRAVACHOL) 10 MG tablet Take 1 tablet (10 mg total) by mouth daily. (Patient not taking: Reported on 11/26/2019) 90 tablet 0   Facility-Administered Medications Prior to Visit  Medication Dose Route Frequency Provider Last Rate Last Admin  . 0.9 %  sodium chloride infusion  500 mL Intravenous Once Armbruster, Carlota Raspberry, MD      . 0.9 %  sodium chloride infusion  500 mL Intravenous Once Armbruster, Carlota Raspberry, MD        No Known Allergies  Review of Systems  Constitutional: Negative.   HENT: Negative.   Eyes: Negative.   Respiratory: Negative.   Cardiovascular: Negative.   Gastrointestinal: Negative.   Endocrine: Negative.   Genitourinary: Negative.   Musculoskeletal: Negative.   Skin: Positive for rash. Negative for color change, pallor and wound.  Allergic/Immunologic: Negative.   Neurological: Negative.   Hematological: Negative.   Psychiatric/Behavioral: Negative.   All other systems reviewed and are negative.      Objective:      Physical Exam Vitals and nursing note reviewed.  Constitutional:      General: She is not in acute distress.    Appearance: Normal appearance. She is normal weight. She is not ill-appearing, toxic-appearing or diaphoretic.  HENT:     Head: Normocephalic and atraumatic.  Cardiovascular:     Rate and Rhythm: Normal rate and regular rhythm.  Pulmonary:     Effort: Pulmonary effort is normal. No respiratory distress.  Skin:    General: Skin is warm and dry.     Capillary Refill: Capillary  refill takes less than 2 seconds.     Coloration: Skin is not jaundiced or pale.     Findings: Rash present. No bruising, erythema or lesion.          Comments: Nondescript rash, flat, does not appear to be infectious. Does not appear to be related to a systemic condition. Perhaps eczema? But not in flexural area  Neurological:     General: No focal deficit present.     Mental Status: She is alert and oriented to person, place, and time. Mental status is at baseline.     Cranial Nerves: No cranial nerve deficit.  Psychiatric:        Mood and Affect: Mood normal.        Behavior: Behavior normal.        Thought Content: Thought content normal.        Judgment: Judgment normal.     BP (!) 152/89   Pulse 74   Temp (!) 97.5 F (36.4 C) (Temporal)   Ht 5\' 9"  (1.753 m)   Wt 207 lb 3.2 oz (94 kg)   LMP 01/11/2016 (Approximate)   SpO2 99%   BMI 30.60 kg/m  Wt Readings from Last 3 Encounters:  12/27/19 207 lb 3.2 oz (94 kg)  11/26/19 201 lb 4.5 oz (91.3 kg)  08/06/19 203 lb 9.6 oz (92.4 kg)    There are no preventive care reminders to display for this patient.  There are no preventive care reminders to display for this patient.   No results found for: TSH Lab Results  Component Value Date   WBC 6.8 05/21/2018   HGB 13.7 05/21/2018   HCT 43 05/21/2018   MCV 74.5 (L) 03/07/2016   PLT 306 05/21/2018   Lab Results  Component Value Date   NA 144 04/26/2019   K 4.2 04/26/2019   CO2 21  04/26/2019   GLUCOSE 94 04/26/2019   BUN 9 04/26/2019   CREATININE 0.74 04/26/2019   BILITOT 0.5 04/26/2019   ALKPHOS 91 04/26/2019   AST 25 08/06/2019   ALT 27 08/06/2019   PROT 8.0 04/26/2019   ALBUMIN 4.4 04/26/2019   CALCIUM 9.8 04/26/2019   ANIONGAP 6 01/26/2016   Lab Results  Component Value Date   CHOL 194 08/06/2019   Lab Results  Component Value Date   HDL 70 08/06/2019   Lab Results  Component Value Date   LDLCALC 112 (H) 08/06/2019   Lab Results  Component Value Date   TRIG 63 08/06/2019   Lab Results  Component Value Date   CHOLHDL 2.8 08/06/2019   Lab Results  Component Value Date   HGBA1C 5.7 05/21/2018       Assessment & Plan:   Problem List Items Addressed This Visit    None    Visit Diagnoses    Rash and nonspecific skin eruption    -  Primary   Relevant Orders   Ambulatory referral to Dermatology       No orders of the defined types were placed in this encounter.  PLAN  Does not appear infectious. Resume use of betamethasone   Refer to derm in case this does not resolve in coming weeks  Given return to clinic precautions  Consulted Dr. Pamella Pert on this case, she was in agreement with plan  Patient encouraged to call clinic with any questions, comments, or concerns.   Maximiano Coss, NP

## 2020-01-28 ENCOUNTER — Encounter: Payer: Self-pay | Admitting: Family Medicine

## 2020-01-28 ENCOUNTER — Other Ambulatory Visit: Payer: Self-pay

## 2020-01-28 ENCOUNTER — Ambulatory Visit (INDEPENDENT_AMBULATORY_CARE_PROVIDER_SITE_OTHER): Payer: BC Managed Care – PPO

## 2020-01-28 ENCOUNTER — Ambulatory Visit: Payer: BC Managed Care – PPO | Admitting: Family Medicine

## 2020-01-28 VITALS — BP 125/85 | HR 75 | Temp 97.8°F | Resp 16 | Ht 69.0 in | Wt 201.3 lb

## 2020-01-28 DIAGNOSIS — M2550 Pain in unspecified joint: Secondary | ICD-10-CM

## 2020-01-28 DIAGNOSIS — Z13228 Encounter for screening for other metabolic disorders: Secondary | ICD-10-CM | POA: Diagnosis not present

## 2020-01-28 DIAGNOSIS — Z114 Encounter for screening for human immunodeficiency virus [HIV]: Secondary | ICD-10-CM

## 2020-01-28 DIAGNOSIS — M898X9 Other specified disorders of bone, unspecified site: Secondary | ICD-10-CM

## 2020-01-28 DIAGNOSIS — E785 Hyperlipidemia, unspecified: Secondary | ICD-10-CM | POA: Diagnosis not present

## 2020-01-28 NOTE — Patient Instructions (Signed)
° ° ° °  If you have lab work done today you will be contacted with your lab results within the next 2 weeks.  If you have not heard from us then please contact us. The fastest way to get your results is to register for My Chart. ° ° °IF you received an x-ray today, you will receive an invoice from [facility]. Please contact [facility] at [phone] with questions or concerns regarding your invoice.  ° °IF you received labwork today, you will receive an invoice from [facility]. Please contact [facility] at [phone] with questions or concerns regarding your invoice.  ° °Our billing staff will not be able to assist you with questions regarding bills from these companies. ° °You will be contacted with the lab results as soon as they are available. The fastest way to get your results is to activate your My Chart account. Instructions are located on the last page of this paperwork. If you have not heard from us regarding the results in 2 weeks, please contact this office. °  ° ° ° °

## 2020-01-28 NOTE — Progress Notes (Signed)
Established Patient Office Visit  Subjective:  Patient ID: Marilyn Jensen, female    DOB: 29-Dec-1965  Age: 54 y.o. MRN: [id number]  CC:  Chief Complaint  Patient presents with  . Hyperlipidemia    pt has been working on a healthier diet and exercise to improve this  . Hypertension    no physical symptoms per pt, pt checks BP at home occasionally    HPI Marilyn Jensen presents for  Overweight She reports that she is exercising and discussed that she would like to continue to lose more weight. She is still exercising. Body mass index is 29.73 kg/m.  Wt Readings from Last 3 Encounters:  01/28/20 201 lb 4.8 oz (91.3 kg)  12/27/19 207 lb 3.2 oz (94 kg)  11/26/19 201 lb 4.5 oz (91.3 kg)   Hypertension Patient reports that she was seen for an acute visit for a rash in February Her bp was high so she cut back on her salt intake and increase exercise She has lost six pounds She denies Lower extremity edema She gets good blood pressures at home.  BP Readings from Last 3 Encounters:  01/28/20 125/85  12/27/19 (!) 152/89  08/06/19 130/79    Polyarthralgia Pain in her legs at night and hips and back ONLY at night She feels like there is an aching to the bone and it  Patient states that in the daytime there is no pain but that her pain is occurring more consistently at night There is no muscle cramping She only has the pain when she gets in the bed She cannot find a comfortable spot to sleep   Past Medical History:  Diagnosis Date  . Anemia   . Anxiety   . Asthma   . Blood transfusion without reported diagnosis   . Fibroid   . GERD (gastroesophageal reflux disease)     Past Surgical History:  Procedure Laterality Date  . ABDOMINAL HYSTERECTOMY  02/01/2016   Procedure: HYSTERECTOMY ABDOMINAL, Bilateral Salpingectomy, Lysis of Adhesions;  Surgeon: Crawford Givens, MD;  Location: [facility];  Service: Gynecology;;  Procedure #2  . CESAREAN SECTION    . CHOLECYSTECTOMY    .  CYSTO  02/01/2016   Procedure: CYSTO;  Surgeon: Crawford Givens, MD;  Location: [facility];  Service: Gynecology;;  . EMBOLIZATION     fibroids  . LAPAROSCOPY  02/01/2016   Procedure: LAPAROSCOPY DIAGNOSTIC;  Surgeon: Crawford Givens, MD;  Location: [facility];  Service: Gynecology;;  Procedure #1  . MYOMECTOMY    . THYROIDECTOMY, PARTIAL    . under arm surgery     fatty tissue removal    No family history on file.  Social History   Socioeconomic History  . Marital status: Divorced    Spouse name: Not on file  . Number of children: 1  . Years of education: Not on file  . Highest education level: Not on file  Occupational History  . Not on file  Tobacco Use  . Smoking status: Never Smoker  . Smokeless tobacco: Never Used  Substance and Sexual Activity  . Alcohol use: Yes    Comment: occ  . Drug use: No  . Sexual activity: Not Currently  Other Topics Concern  . Not on file  Social History Narrative  . Not on file   Social Determinants of Health   Financial Resource Strain:   . Difficulty of Paying Living Expenses: Not on file  Food Insecurity:   . Worried About Charity fundraiser in  the Last Year: Not on file  . Ran Out of Food in the Last Year: Not on file  Transportation Needs:   . Lack of Transportation (Medical): Not on file  . Lack of Transportation (Non-Medical): Not on file  Physical Activity:   . Days of Exercise per Week: Not on file  . Minutes of Exercise per Session: Not on file  Stress:   . Feeling of Stress : Not on file  Social Connections:   . Frequency of Communication with Friends and Family: Not on file  . Frequency of Social Gatherings with Friends and Family: Not on file  . Attends Religious Services: Not on file  . Active Member of Clubs or Organizations: Not on file  . Attends Archivist Meetings: Not on file  . Marital Status: Not on file  Intimate Partner Violence:   . Fear of Current or Ex-Partner: Not on file  . Emotionally Abused: Not  on file  . Physically Abused: Not on file  . Sexually Abused: Not on file    Outpatient Medications Prior to Visit  Medication Sig Dispense Refill  . albuterol (PROAIR HFA) 108 (90 Base) MCG/ACT inhaler INHALE 2 PUFFS BY MOUTH EVERY 4-6 HRS AS NEEDED 8.5 Inhaler 3  . clonazePAM (KLONOPIN) 0.5 MG tablet TAKE 1 TABLET BY MOUTH TWICE A DAY AS NEEDED 30 tablet 0  . amoxicillin (AMOXIL) 875 MG tablet Take 1 tablet (875 mg total) by mouth 2 (two) times daily. (Patient not taking: Reported on 12/27/2019) 14 tablet 0  . betamethasone dipropionate 0.05 % cream Apply topically 2 (two) times daily. 30 g 0  . cetirizine (ZYRTEC) 10 MG tablet Take 1 tablet (10 mg total) by mouth daily. (Patient not taking: Reported on 12/27/2019) 30 tablet 11  . fluticasone (FLONASE) 50 MCG/ACT nasal spray Place 2 sprays into both nostrils daily. (Patient not taking: Reported on 12/27/2019) 16 g 6  . guaiFENesin-dextromethorphan (ROBITUSSIN DM) 100-10 MG/5ML syrup Take 5 mLs by mouth every 4 (four) hours as needed for cough. (Patient not taking: Reported on 12/27/2019) 118 mL 3  . ketoconazole (NIZORAL) 2 % cream Apply 1 application topically daily. (Patient not taking: Reported on 01/28/2020) 15 g 0  . pravastatin (PRAVACHOL) 10 MG tablet Take 1 tablet (10 mg total) by mouth daily. (Patient not taking: Reported on 11/26/2019) 90 tablet 0   Facility-Administered Medications Prior to Visit  Medication Dose Route Frequency Provider Last Rate Last Admin  . 0.9 %  sodium chloride infusion  500 mL Intravenous Once Armbruster, Carlota Raspberry, MD      . 0.9 %  sodium chloride infusion  500 mL Intravenous Once Armbruster, Carlota Raspberry, MD        No Known Allergies  ROS Review of Systems .   Objective:     Vitals:   01/28/20 0934  BP: 125/85  Pulse: 75  Resp: 16  Temp: 97.8 F (36.6 C)  TempSrc: Temporal  SpO2: 98%  Weight: 201 lb 4.8 oz (91.3 kg)  Height: 5\' 9"  (1.753 m)    Physical Exam  Constitutional: She is oriented to person,  place, and time. She appears well-developed and well-nourished.  HENT:  Head: Normocephalic and atraumatic.  Eyes: Conjunctivae and EOM are normal.  Cardiovascular: Normal rate, regular rhythm and normal heart sounds.  No murmur heard. Pulmonary/Chest: Effort normal and breath sounds normal. No respiratory distress. She has no wheezes. She has no rales.  Musculoskeletal:     Lumbar back: Pain, spasms and  bony tenderness present. No swelling, edema, deformity, lacerations or tenderness. Normal range of motion.     Right hip: No swelling, deformity, lacerations, tenderness, bony tenderness or crepitus. Normal range of motion. Normal strength.     Left hip: No swelling, deformity, lacerations, tenderness, bony tenderness or crepitus. Normal range of motion. Normal strength.     Right knee: Normal. No swelling. Normal range of motion. No tenderness.     Left knee: Normal. No swelling. Normal range of motion. No tenderness.  Neurological: She is alert and oriented to person, place, and time. She has normal reflexes.  Psychiatric: She has a normal mood and affect. Her behavior is normal. Judgment and thought content normal.    COMPARISON:  None.  FINDINGS: There is no evidence of lumbar spine fracture. Alignment is normal. Intervertebral disc spaces are maintained.  IMPRESSION: Negative.   Electronically Signed   By: Marijo Conception M.D.   On: 01/28/2020 10:30   DG HIP (WITH OR WITHOUT PELVIS) 2V BILAT  COMPARISON:  None.  FINDINGS: There is no evidence of hip fracture or dislocation. There is no evidence of arthropathy or other focal bone abnormality.  IMPRESSION: Negative.   Electronically Signed   By: Marijo Conception M.D.   On: 01/28/2020 10:29  Health Maintenance Due  Topic Date Due  . HIV Screening  10/03/1983    There are no preventive care reminders to display for this patient.  No results found for: TSH Lab Results  Component Value Date   WBC 6.8  05/21/2018   HGB 13.7 05/21/2018   HCT 43 05/21/2018   MCV 74.5 (L) 03/07/2016   PLT 306 05/21/2018   Lab Results  Component Value Date   NA 144 04/26/2019   K 4.2 04/26/2019   CO2 21 04/26/2019   GLUCOSE 94 04/26/2019   BUN 9 04/26/2019   CREATININE 0.74 04/26/2019   BILITOT 0.5 04/26/2019   ALKPHOS 91 04/26/2019   AST 25 08/06/2019   ALT 27 08/06/2019   PROT 8.0 04/26/2019   ALBUMIN 4.4 04/26/2019   CALCIUM 9.8 04/26/2019   ANIONGAP 6 01/26/2016   Lab Results  Component Value Date   CHOL 194 08/06/2019   Lab Results  Component Value Date   HDL 70 08/06/2019   Lab Results  Component Value Date   LDLCALC 112 (H) 08/06/2019   Lab Results  Component Value Date   TRIG 63 08/06/2019   Lab Results  Component Value Date   CHOLHDL 2.8 08/06/2019   Lab Results  Component Value Date   HGBA1C 5.7 05/21/2018      Assessment & Plan:   Problem List Items Addressed This Visit    None    Visit Diagnoses    Dyslipidemia    -  Primary Discussed that she should continue a healthy diet Her labs are checked today since she is not on the statin If her cholesterol is still doing well she may remain   Relevant Orders   Lipid panel   Comprehensive metabolic panel   Screening for HIV (human immunodeficiency virus)       Relevant Orders   HIV antibody (with reflex)   Encounter for screening for other metabolic disorders       Polyarthralgia    -  Discussed that her xray are normal.    Relevant Orders   Ferritin   CBC   DG HIPS BILAT W OR W/O PELVIS 2V   DG Lumbar Spine Complete  Bone pain    -  Discussed bone pain and will evaluate with labs and xray Xrays are normal Discussed that will follow up on labs   Relevant Orders   Ferritin   DG HIPS BILAT W OR W/O PELVIS 2V   DG Lumbar Spine Complete      No orders of the defined types were placed in this encounter.   Follow-up: No follow-ups on file.    Forrest Moron, MD

## 2020-01-29 LAB — COMPREHENSIVE METABOLIC PANEL
ALT: 42 IU/L — ABNORMAL HIGH (ref 0–32)
AST: 34 IU/L (ref 0–40)
Albumin/Globulin Ratio: 1.3 (ref 1.2–2.2)
Albumin: 4.4 g/dL (ref 3.8–4.9)
Alkaline Phosphatase: 117 IU/L (ref 39–117)
BUN/Creatinine Ratio: 13 (ref 9–23)
BUN: 11 mg/dL (ref 6–24)
Bilirubin Total: 0.6 mg/dL (ref 0.0–1.2)
CO2: 24 mmol/L (ref 20–29)
Calcium: 9.7 mg/dL (ref 8.7–10.2)
Chloride: 102 mmol/L (ref 96–106)
Creatinine, Ser: 0.87 mg/dL (ref 0.57–1.00)
GFR calc Af Amer: 89 mL/min/{1.73_m2} (ref >59)
GFR calc non Af Amer: 77 mL/min/{1.73_m2} (ref >59)
Globulin, Total: 3.3 g/dL (ref 1.5–4.5)
Glucose: 85 mg/dL (ref 65–99)
Potassium: 4.7 mmol/L (ref 3.5–5.2)
Sodium: 141 mmol/L (ref 134–144)
Total Protein: 7.7 g/dL (ref 6.0–8.5)

## 2020-01-29 LAB — CBC
Hematocrit: 39.5 % (ref 34.0–46.6)
Hemoglobin: 13.2 g/dL (ref 11.1–15.9)
MCH: 28.1 pg (ref 26.6–33.0)
MCHC: 33.4 g/dL (ref 31.5–35.7)
MCV: 84 fL (ref 79–97)
Platelets: 302 10*3/uL (ref 150–450)
RBC: 4.7 x10E6/uL (ref 3.77–5.28)
RDW: 13.2 % (ref 11.7–15.4)
WBC: 8.5 10*3/uL (ref 3.4–10.8)

## 2020-01-29 LAB — FERRITIN: Ferritin: 55 ng/mL (ref 15–150)

## 2020-01-29 LAB — HIV ANTIBODY (ROUTINE TESTING W REFLEX): HIV Screen 4th Generation wRfx: NONREACTIVE

## 2020-01-29 LAB — LIPID PANEL
Chol/HDL Ratio: 2.5 ratio (ref 0.0–4.4)
Cholesterol, Total: 215 mg/dL — ABNORMAL HIGH (ref 100–199)
HDL: 86 mg/dL (ref >39)
LDL Chol Calc (NIH): 117 mg/dL — ABNORMAL HIGH (ref 0–99)
Triglycerides: 66 mg/dL (ref 0–149)
VLDL Cholesterol Cal: 12 mg/dL (ref 5–40)

## 2020-02-04 ENCOUNTER — Ambulatory Visit: Payer: BC Managed Care – PPO | Admitting: Family Medicine

## 2020-02-06 ENCOUNTER — Encounter: Payer: Self-pay | Admitting: Adult Health Nurse Practitioner

## 2020-02-06 ENCOUNTER — Ambulatory Visit: Payer: Self-pay

## 2020-02-06 ENCOUNTER — Telehealth (INDEPENDENT_AMBULATORY_CARE_PROVIDER_SITE_OTHER): Payer: BC Managed Care – PPO | Admitting: Adult Health Nurse Practitioner

## 2020-02-06 ENCOUNTER — Other Ambulatory Visit: Payer: Self-pay

## 2020-02-06 DIAGNOSIS — L508 Other urticaria: Secondary | ICD-10-CM | POA: Diagnosis not present

## 2020-02-06 NOTE — Progress Notes (Signed)
Virtual Visit via Video Note  I connected with Rochele Pages on 02/06/20 at 10:30 AM EDT by a video enabled telemedicine application and verified that I am speaking with the correct person using two identifiers.  Location: Patient: home  Provider: [place]    I discussed the limitations of evaluation and management by telemedicine and the availability of in person appointments. The patient expressed understanding and agreed to proceed.  History of Present Illness: Patient calls about a rash that she noted this morning.  It has appeared around her neck.  She is unsure what caused it.  She has some light itching.  And feels like the rash has gotten a little bit better since she first noticed it this morning.  She did have the Covid shot and a steroid shot as well in the past week.  She wonders if the 2 are connected.  She denies any fever, chills, night sweats.  No new skin creams, soaps, other products that she has just started that would trigger a reaction.   Observations/Objective:  General appearance: alert, well appearing, and in no distress and oriented to person, place, and time. Skin exam - from the video, it appears there are small, macular areas surrounding her neck, slightly raised.  Assessment and Plan:  1. Other urticaria      Follow Up Instructions:  She recently received third betamethasone from her dermatologist.  Has used it for another area on her body.  Have asked her to try this on the rash to see if it improves.  In addition, she could use over-the-counter Zyrtec or Benadryl.  Should rash not improve we will consider an oral steroid.  She is in line with this plan.  Mental   I discussed the assessment and treatment plan with the patient. The patient was provided an opportunity to ask questions and all were answered. The patient agreed with the plan and demonstrated an understanding of the instructions.   The patient was advised to call back or seek an  in-person evaluation if the symptoms worsen or if the condition fails to improve as anticipated.  I provided 10 minutes of non-face-to-face time during this encounter.   Glyn Ade, NP

## 2020-02-06 NOTE — Telephone Encounter (Signed)
  Pt. Reports she woke up this morning with "a fine ,red, raised rash" on her arms and trunk. Areas are the siZe of a pen tip. Mild itching. No fever. Had her first COVID 19 vaccine last Thursday. Warm transfer to [facility] in the practice for a visit. Answer Assessment - Initial Assessment Questions 1. APPEARANCE of RASH: "Describe the rash." (e.g., spots, blisters, raised areas, skin peeling, scaly)     Raised small bumps 2. SIZE: "How big are the spots?" (e.g., tip of pen, eraser, coin; inches, centimeters)     Tip of pen 3. LOCATION: "Where is the rash located?"     Trunk and arms 4. COLOR: "What color is the rash?" (Note: It is difficult to assess rash color in people with darker-colored skin. When this situation occurs, simply ask the caller to describe what they see.)     Red 5. ONSET: "When did the rash begin?"     This morning 6. FEVER: "Do you have a fever?" If so, ask: "What is your temperature, how was it measured, and when did it start?"     No 7. ITCHING: "Does the rash itch?" If so, ask: "How bad is the itch?" (Scale 1-10; or mild, moderate, severe)     Mild 8. CAUSE: "What do you think is causing the rash?"     Unsure 9. MEDICATION FACTORS: "Have you started any new medications within the last 2 weeks?" (e.g., antibiotics)      No - first COVID 19 vaccine last Thursday 10. OTHER SYMPTOMS: "Do you have any other symptoms?" (e.g., dizziness, headache, sore throat, joint pain)       No 11. PREGNANCY: "Is there any chance you are pregnant?" "When was your last menstrual period?"       No  Protocols used: RASH OR REDNESS - [facility]

## 2020-02-06 NOTE — Patient Instructions (Signed)
° ° ° °  If you have lab work done today you will be contacted with your lab results within the next 2 weeks.  If you have not heard from us then please contact us. The fastest way to get your results is to register for My Chart. ° ° °IF you received an x-ray today, you will receive an invoice from [facility]. Please contact [facility] at [phone] with questions or concerns regarding your invoice.  ° °IF you received labwork today, you will receive an invoice from [facility]. Please contact [facility] at [phone] with questions or concerns regarding your invoice.  ° °Our billing staff will not be able to assist you with questions regarding bills from these companies. ° °You will be contacted with the lab results as soon as they are available. The fastest way to get your results is to activate your My Chart account. Instructions are located on the last page of this paperwork. If you have not heard from us regarding the results in 2 weeks, please contact this office. °  ° ° ° °

## 2020-02-11 ENCOUNTER — Encounter: Payer: Self-pay | Admitting: Family Medicine

## 2020-02-11 NOTE — Telephone Encounter (Signed)
Pt is still having trouble with hives has been taking benadryl like you recommended as well as tried the ointment she had on the location with no improvement it is actually concerned that it is getting worse. Saw in your note you consider oral steroid if it did not start going down. Thank you.

## 2020-04-14 ENCOUNTER — Encounter: Payer: Self-pay | Admitting: Family Medicine

## 2020-04-14 ENCOUNTER — Other Ambulatory Visit: Payer: Self-pay | Admitting: Nurse Practitioner

## 2020-04-22 ENCOUNTER — Encounter: Payer: Self-pay | Admitting: Registered Nurse

## 2020-04-22 ENCOUNTER — Ambulatory Visit (INDEPENDENT_AMBULATORY_CARE_PROVIDER_SITE_OTHER): Payer: BC Managed Care – PPO | Admitting: Registered Nurse

## 2020-04-22 ENCOUNTER — Other Ambulatory Visit: Payer: Self-pay

## 2020-04-22 VITALS — BP 146/91 | HR 70 | Temp 98.6°F | Resp 17 | Ht 69.0 in | Wt 201.6 lb

## 2020-04-22 DIAGNOSIS — F40243 Fear of flying: Secondary | ICD-10-CM

## 2020-04-22 DIAGNOSIS — R110 Nausea: Secondary | ICD-10-CM

## 2020-04-22 DIAGNOSIS — R630 Anorexia: Secondary | ICD-10-CM | POA: Diagnosis not present

## 2020-04-22 DIAGNOSIS — R6889 Other general symptoms and signs: Secondary | ICD-10-CM

## 2020-04-22 MED ORDER — CLONAZEPAM 0.5 MG PO TABS: 0.5000 mg | ORAL_TABLET | Freq: Two times a day (BID) | ORAL | 0 refills | Status: AC | PRN

## 2020-04-22 NOTE — Patient Instructions (Signed)
° ° ° °  If you have lab work done today you will be contacted with your lab results within the next 2 weeks.  If you have not heard from us then please contact us. The fastest way to get your results is to register for My Chart. ° ° °IF you received an x-ray today, you will receive an invoice from [facility]. Please contact [facility] at [phone] with questions or concerns regarding your invoice.  ° °IF you received labwork today, you will receive an invoice from [facility]. Please contact [facility] at [phone] with questions or concerns regarding your invoice.  ° °Our billing staff will not be able to assist you with questions regarding bills from these companies. ° °You will be contacted with the lab results as soon as they are available. The fastest way to get your results is to activate your My Chart account. Instructions are located on the last page of this paperwork. If you have not heard from us regarding the results in 2 weeks, please contact this office. °  ° ° ° °

## 2020-04-23 LAB — COMPREHENSIVE METABOLIC PANEL
ALT: 19 IU/L (ref 0–32)
AST: 16 IU/L (ref 0–40)
Albumin/Globulin Ratio: 1.3 (ref 1.2–2.2)
Albumin: 4.3 g/dL (ref 3.8–4.9)
Alkaline Phosphatase: 102 IU/L (ref 48–121)
BUN/Creatinine Ratio: 12 (ref 9–23)
BUN: 9 mg/dL (ref 6–24)
Bilirubin Total: 0.5 mg/dL (ref 0.0–1.2)
CO2: 25 mmol/L (ref 20–29)
Calcium: 9.7 mg/dL (ref 8.7–10.2)
Chloride: 102 mmol/L (ref 96–106)
Creatinine, Ser: 0.75 mg/dL (ref 0.57–1.00)
GFR calc Af Amer: 107 mL/min/{1.73_m2} (ref >59)
GFR calc non Af Amer: 93 mL/min/{1.73_m2} (ref >59)
Globulin, Total: 3.2 g/dL (ref 1.5–4.5)
Glucose: 75 mg/dL (ref 65–99)
Potassium: 4.3 mmol/L (ref 3.5–5.2)
Sodium: 139 mmol/L (ref 134–144)
Total Protein: 7.5 g/dL (ref 6.0–8.5)

## 2020-04-23 LAB — LIPASE: Lipase: 20 U/L (ref 14–72)

## 2020-04-23 LAB — CBC
Hematocrit: 40.8 % (ref 34.0–46.6)
Hemoglobin: 13.2 g/dL (ref 11.1–15.9)
MCH: 27.5 pg (ref 26.6–33.0)
MCHC: 32.4 g/dL (ref 31.5–35.7)
MCV: 85 fL (ref 79–97)
Platelets: 291 10*3/uL (ref 150–450)
RBC: 4.8 x10E6/uL (ref 3.77–5.28)
RDW: 13.1 % (ref 11.7–15.4)
WBC: 6.9 10*3/uL (ref 3.4–10.8)

## 2020-04-23 LAB — AMYLASE: Amylase: 66 U/L (ref 31–110)

## 2020-04-23 LAB — TSH: TSH: 1.77 u[IU]/mL (ref 0.450–4.500)

## 2020-04-25 ENCOUNTER — Encounter: Payer: Self-pay | Admitting: Registered Nurse

## 2020-05-04 ENCOUNTER — Other Ambulatory Visit: Payer: Self-pay

## 2020-05-04 ENCOUNTER — Encounter: Payer: Self-pay | Admitting: Registered Nurse

## 2020-05-04 ENCOUNTER — Ambulatory Visit: Payer: BC Managed Care – PPO | Admitting: Registered Nurse

## 2020-05-04 VITALS — BP 138/94 | HR 71 | Temp 97.9°F | Ht 69.0 in | Wt 203.8 lb

## 2020-05-04 DIAGNOSIS — M25562 Pain in left knee: Secondary | ICD-10-CM

## 2020-05-04 DIAGNOSIS — M25561 Pain in right knee: Secondary | ICD-10-CM

## 2020-05-04 NOTE — Progress Notes (Signed)
Established Patient Office Visit  Subjective:  Patient ID: Marilyn Jensen, female    DOB: 05-22-1966  Age: 54 y.o. MRN: [id number]  CC:  Chief Complaint  Patient presents with  . FMLA    Talk about Paperwork  (Stress)    HPI Marilyn Jensen presents for stress and knee pain  Stress: worse lately as she feels that she has fallen behind on routine health maintenance - dental visits, PT, ortho, etc. Requesting FMLA as she hopes to get established with counselor and caught up on appts as COVID winds down. Has high stress job at [employer].   In addition, having recurrence of knee pain. Has been on and off for over a year. Improved somewhat with weight loss last fall, but now that she is becoming more active, it is becoming harder to bare. Joint line of R knee is worst, but diffuse arthralgias and myalgias of both lower extremities. Seems to worsen at rest - but other times is ok. No acute injury. Does have hx of Vit D deficiency.  Past Medical History:  Diagnosis Date  . Anemia   . Anxiety   . Asthma   . Blood transfusion without reported diagnosis   . Fibroid   . GERD (gastroesophageal reflux disease)     Past Surgical History:  Procedure Laterality Date  . ABDOMINAL HYSTERECTOMY  02/01/2016   Procedure: HYSTERECTOMY ABDOMINAL, Bilateral Salpingectomy, Lysis of Adhesions;  Surgeon: Crawford Givens, MD;  Location: [facility];  Service: Gynecology;;  Procedure #2  . CESAREAN SECTION    . CHOLECYSTECTOMY    . CYSTO  02/01/2016   Procedure: CYSTO;  Surgeon: Crawford Givens, MD;  Location: [facility];  Service: Gynecology;;  . EMBOLIZATION     fibroids  . LAPAROSCOPY  02/01/2016   Procedure: LAPAROSCOPY DIAGNOSTIC;  Surgeon: Crawford Givens, MD;  Location: [facility];  Service: Gynecology;;  Procedure #1  . MYOMECTOMY    . THYROIDECTOMY, PARTIAL    . under arm surgery     fatty tissue removal    No family history on file.  Social History   Socioeconomic History  . Marital status: Divorced    Spouse  name: Not on file  . Number of children: 1  . Years of education: Not on file  . Highest education level: Not on file  Occupational History  . Not on file  Tobacco Use  . Smoking status: Never Smoker  . Smokeless tobacco: Never Used  Vaping Use  . Vaping Use: Never used  Substance and Sexual Activity  . Alcohol use: Yes    Comment: occ  . Drug use: No  . Sexual activity: Not Currently  Other Topics Concern  . Not on file  Social History Narrative  . Not on file   Social Determinants of Health   Financial Resource Strain:   . Difficulty of Paying Living Expenses:   Food Insecurity:   . Worried About Charity fundraiser in the Last Year:   . Arboriculturist in the Last Year:   Transportation Needs:   . Film/video editor (Medical):   Marland Kitchen Lack of Transportation (Non-Medical):   Physical Activity:   . Days of Exercise per Week:   . Minutes of Exercise per Session:   Stress:   . Feeling of Stress :   Social Connections:   . Frequency of Communication with Friends and Family:   . Frequency of Social Gatherings with Friends and Family:   . Attends Religious Services:   .  Active Member of Clubs or Organizations:   . Attends Archivist Meetings:   Marland Kitchen Marital Status:   Intimate Partner Violence:   . Fear of Current or Ex-Partner:   . Emotionally Abused:   Marland Kitchen Physically Abused:   . Sexually Abused:     Outpatient Medications Prior to Visit  Medication Sig Dispense Refill  . albuterol (PROAIR HFA) 108 (90 Base) MCG/ACT inhaler INHALE 2 PUFFS BY MOUTH EVERY 4-6 HRS AS NEEDED 8.5 Inhaler 3  . clonazePAM (KLONOPIN) 0.5 MG tablet Take 1 tablet (0.5 mg total) by mouth 2 (two) times daily as needed. 30 tablet 0  . betamethasone dipropionate 0.05 % cream Apply topically 2 (two) times daily. (Patient not taking: Reported on 04/22/2020) 30 g 0   Facility-Administered Medications Prior to Visit  Medication Dose Route Frequency Provider Last Rate Last Admin  . 0.9 %  sodium  chloride infusion  500 mL Intravenous Once Armbruster, Carlota Raspberry, MD      . 0.9 %  sodium chloride infusion  500 mL Intravenous Once Armbruster, Carlota Raspberry, MD        No Known Allergies  ROS Review of Systems  Constitutional: Negative.   HENT: Negative.   Eyes: Negative.   Respiratory: Negative.   Cardiovascular: Negative.   Gastrointestinal: Negative.   Endocrine: Negative.   Genitourinary: Negative.   Musculoskeletal: Positive for arthralgias and myalgias. Negative for back pain, gait problem, joint swelling, neck pain and neck stiffness.  Skin: Negative.   Allergic/Immunologic: Negative.   Neurological: Negative.   Hematological: Negative.   Psychiatric/Behavioral: Negative for agitation, behavioral problems, confusion, decreased concentration, dysphoric mood, hallucinations, self-injury, sleep disturbance and suicidal ideas. The patient is nervous/anxious. The patient is not hyperactive.   All other systems reviewed and are negative.     Objective:    Physical Exam Vitals and nursing note reviewed.  Constitutional:      General: She is not in acute distress.    Appearance: Normal appearance. She is normal weight. She is not ill-appearing, toxic-appearing or diaphoretic.  Cardiovascular:     Rate and Rhythm: Normal rate and regular rhythm.  Pulmonary:     Effort: Pulmonary effort is normal. No respiratory distress.  Skin:    Capillary Refill: Capillary refill takes less than 2 seconds.  Neurological:     General: No focal deficit present.     Mental Status: She is alert and oriented to person, place, and time. Mental status is at baseline.  Psychiatric:        Mood and Affect: Mood normal.        Behavior: Behavior normal.        Thought Content: Thought content normal.        Judgment: Judgment normal.     BP (!) 138/94   Pulse 71   Temp 97.9 F (36.6 C) (Temporal)   Ht 5\' 9"  (1.753 m)   Wt 203 lb 12.8 oz (92.4 kg)   LMP 01/11/2016 (Approximate)   SpO2 98%    BMI 30.10 kg/m  Wt Readings from Last 3 Encounters:  05/04/20 203 lb 12.8 oz (92.4 kg)  04/22/20 201 lb 9.6 oz (91.4 kg)  01/28/20 201 lb 4.8 oz (91.3 kg)     Health Maintenance Due  Topic Date Due  . Hepatitis C Screening  Never done  . COVID-19 Vaccine (1) Never done    There are no preventive care reminders to display for this patient.  Lab Results  Component Value Date  TSH 1.770 04/22/2020   Lab Results  Component Value Date   WBC 6.9 04/22/2020   HGB 13.2 04/22/2020   HCT 40.8 04/22/2020   MCV 85 04/22/2020   PLT 291 04/22/2020   Lab Results  Component Value Date   NA 139 04/22/2020   K 4.3 04/22/2020   CO2 25 04/22/2020   GLUCOSE 75 04/22/2020   BUN 9 04/22/2020   CREATININE 0.75 04/22/2020   BILITOT 0.5 04/22/2020   ALKPHOS 102 04/22/2020   AST 16 04/22/2020   ALT 19 04/22/2020   PROT 7.5 04/22/2020   ALBUMIN 4.3 04/22/2020   CALCIUM 9.7 04/22/2020   ANIONGAP 6 01/26/2016   Lab Results  Component Value Date   CHOL 215 (H) 01/28/2020   Lab Results  Component Value Date   HDL 86 01/28/2020   Lab Results  Component Value Date   LDLCALC 117 (H) 01/28/2020   Lab Results  Component Value Date   TRIG 66 01/28/2020   Lab Results  Component Value Date   CHOLHDL 2.5 01/28/2020   Lab Results  Component Value Date   HGBA1C 5.7 05/21/2018      Assessment & Plan:   Problem List Items Addressed This Visit    None    Visit Diagnoses    Arthralgia of both knees    -  Primary   Relevant Orders   Vitamin D, 25-hydroxy   Vitamin B12   Magnesium   ANA w/Reflex   Sedimentation Rate   C-reactive protein      No orders of the defined types were placed in this encounter.   Follow-up: No follow-ups on file.   PLAN  FMLA paperwork filled out with patient - agree that she would benefit from a mental and physical health standpoint to take time away from work  Will draw labs, suspect diffuse pain is related to vit d deficiency that was  noted, pt reports noncompliance with medications, never rechecked. If other etiology becomes apparent, will treat as indicated  Pt moving to [place] in August for job - discussed refills can be given for 3 mos after moving to give her time to set up with new PCP  Patient encouraged to call clinic with any questions, comments, or concerns.  Maximiano Coss, NP

## 2020-05-04 NOTE — Patient Instructions (Signed)
° ° ° °  If you have lab work done today you will be contacted with your lab results within the next 2 weeks.  If you have not heard from us then please contact us. The fastest way to get your results is to register for My Chart. ° ° °IF you received an x-ray today, you will receive an invoice from [facility]. Please contact [facility] at [phone] with questions or concerns regarding your invoice.  ° °IF you received labwork today, you will receive an invoice from [facility]. Please contact [facility] at [phone] with questions or concerns regarding your invoice.  ° °Our billing staff will not be able to assist you with questions regarding bills from these companies. ° °You will be contacted with the lab results as soon as they are available. The fastest way to get your results is to activate your My Chart account. Instructions are located on the last page of this paperwork. If you have not heard from us regarding the results in 2 weeks, please contact this office. °  ° ° ° °

## 2020-05-05 ENCOUNTER — Telehealth: Payer: Self-pay | Admitting: Registered Nurse

## 2020-05-05 ENCOUNTER — Encounter: Payer: Self-pay | Admitting: Registered Nurse

## 2020-05-05 LAB — ENA+DNA/DS+SJORGEN'S
ENA RNP Ab: 1 AI — ABNORMAL HIGH (ref 0.0–0.9)
ENA SM Ab Ser-aCnc: <0.2 AI (ref 0.0–0.9)
ENA SSA (RO) Ab: <0.2 AI (ref 0.0–0.9)
ENA SSB (LA) Ab: <0.2 AI (ref 0.0–0.9)
dsDNA Ab: <1 IU/mL (ref 0–9)

## 2020-05-05 LAB — VITAMIN B12: Vitamin B-12: 520 pg/mL (ref 232–1245)

## 2020-05-05 LAB — C-REACTIVE PROTEIN: CRP: 3 mg/L (ref 0–10)

## 2020-05-05 LAB — SEDIMENTATION RATE: Sed Rate: 70 mm/hr — ABNORMAL HIGH (ref 0–40)

## 2020-05-05 LAB — VITAMIN D 25 HYDROXY (VIT D DEFICIENCY, FRACTURES): Vit D, 25-Hydroxy: 9.4 ng/mL — ABNORMAL LOW (ref 30.0–100.0)

## 2020-05-05 LAB — ANA W/REFLEX: Anti Nuclear Antibody (ANA): POSITIVE — AB

## 2020-05-05 LAB — MAGNESIUM: Magnesium: 2.1 mg/dL (ref 1.6–2.3)

## 2020-05-05 NOTE — Telephone Encounter (Signed)
Patient calling to get results for labs   Please advise

## 2020-05-05 NOTE — Telephone Encounter (Signed)
Pt requesting lab results.

## 2020-05-06 NOTE — Telephone Encounter (Signed)
Pt has looked over their labs but are wondering what the values mean and what they should be doing for any abnormalities. What can I tell them?

## 2020-05-07 ENCOUNTER — Other Ambulatory Visit: Payer: Self-pay | Admitting: Registered Nurse

## 2020-05-07 NOTE — Telephone Encounter (Signed)
Pt called about Lab results that were done on 05/04/20 Let pt know that our policy is lab result can take up to 10 days for providers to review.  Pt also wanted provider to know that pts job will not except pts FMLA paperwork that was filled out by NP Orland Mustard. Pt stated that her job stated she would needs a specialist that is certified treating what she has been dealing with. Pt would like a call regarding this matter. Please advise. [phone]

## 2020-05-07 NOTE — Telephone Encounter (Signed)
It do not look like these lab has been reviewed by you yet. Please advise on what you would like for me tell the patient. Also please advise on FMLA forms as well. Do we need to put in a referral for patient in order to get his FMLA form filled out

## 2020-05-08 ENCOUNTER — Other Ambulatory Visit: Payer: Self-pay | Admitting: Registered Nurse

## 2020-05-08 DIAGNOSIS — E559 Vitamin D deficiency, unspecified: Secondary | ICD-10-CM

## 2020-05-08 DIAGNOSIS — R768 Other specified abnormal immunological findings in serum: Secondary | ICD-10-CM

## 2020-05-08 MED ORDER — VITAMIN D (ERGOCALCIFEROL) 1.25 MG (50000 UNIT) PO CAPS: 50000.0000 [IU] | ORAL_CAPSULE | ORAL | 1 refills | Status: DC

## 2020-05-12 ENCOUNTER — Encounter: Payer: Self-pay | Admitting: Registered Nurse

## 2020-05-12 ENCOUNTER — Telehealth: Payer: Self-pay | Admitting: Registered Nurse

## 2020-05-12 NOTE — Telephone Encounter (Signed)
Patient called regarding her referral for rheumatology . She said that the facility that she was referred to only has one provider and can not get her in soon. She would like to be referred to another office.

## 2020-05-18 NOTE — Telephone Encounter (Signed)
Pt called again regarding her referral to get another rheumatology doctor. Pt would like a call regarding when she is sent another referral. Please advise.

## 2020-05-18 NOTE — Telephone Encounter (Signed)
Patient would like a referral to a provider with another rheumatologist if possible .

## 2020-05-19 ENCOUNTER — Other Ambulatory Visit: Payer: Self-pay | Admitting: Registered Nurse

## 2020-05-19 DIAGNOSIS — R768 Other specified abnormal immunological findings in serum: Secondary | ICD-10-CM

## 2020-05-29 ENCOUNTER — Telehealth: Payer: Self-pay

## 2020-05-29 NOTE — Telephone Encounter (Signed)
New referral completed on 6/29

## 2020-05-29 NOTE — Telephone Encounter (Signed)
Copied from [place] [id number]. Topic: General - Other >> May 14, 2020 12:04 PM Oneta Rack wrote: Reason for CRM: patient was advised by  RHEUMATOLOGY the next available appt is not until October. Patient would like referral placed with another specialist that may be bale to see her sooner.

## 2020-07-30 ENCOUNTER — Other Ambulatory Visit: Payer: Self-pay | Admitting: Registered Nurse

## 2020-07-30 DIAGNOSIS — E559 Vitamin D deficiency, unspecified: Secondary | ICD-10-CM

## 2020-08-08 ENCOUNTER — Encounter: Payer: Self-pay | Admitting: Registered Nurse

## 2020-08-08 NOTE — Progress Notes (Signed)
Established Patient Office Visit  Subjective:  Patient ID: Marilyn Jensen, female    DOB: 04-04-1966  Age: 54 y.o. MRN: [id number]  CC:  Chief Complaint  Patient presents with  . Medication Refill    medication refill on Clonazepam. Patient also would like to discuss appetite she states she can eat something and the next week she hate it and she get sick.    HPI Marilyn Jensen presents for med refill on clonazepam  Uses PRN for anxiety. Not daily, only for breakthrough. Does not feel that a daily medication would be useful at this time. Does not wan to change regimen as she is hoping anxiety dies down after some upcoming changes.  Otherwise, notes that she is having some difficulty with rapidly changing taste in foods - sometimes she will be craving one thing and it sounds disgusting within the next week. Has not happened before. No abd pain or change to bowel habits.   Past Medical History:  Diagnosis Date  . Anemia   . Anxiety   . Asthma   . Blood transfusion without reported diagnosis   . Fibroid   . GERD (gastroesophageal reflux disease)     Past Surgical History:  Procedure Laterality Date  . ABDOMINAL HYSTERECTOMY  02/01/2016   Procedure: HYSTERECTOMY ABDOMINAL, Bilateral Salpingectomy, Lysis of Adhesions;  Surgeon: Crawford Givens, MD;  Location: [facility];  Service: Gynecology;;  Procedure #2  . CESAREAN SECTION    . CHOLECYSTECTOMY    . CYSTO  02/01/2016   Procedure: CYSTO;  Surgeon: Crawford Givens, MD;  Location: [facility];  Service: Gynecology;;  . EMBOLIZATION     fibroids  . LAPAROSCOPY  02/01/2016   Procedure: LAPAROSCOPY DIAGNOSTIC;  Surgeon: Crawford Givens, MD;  Location: [facility];  Service: Gynecology;;  Procedure #1  . MYOMECTOMY    . THYROIDECTOMY, PARTIAL    . under arm surgery     fatty tissue removal    No family history on file.  Social History   Socioeconomic History  . Marital status: Divorced    Spouse name: Not on file  . Number of children: 1  .  Years of education: Not on file  . Highest education level: Not on file  Occupational History  . Not on file  Tobacco Use  . Smoking status: Never Smoker  . Smokeless tobacco: Never Used  Vaping Use  . Vaping Use: Never used  Substance and Sexual Activity  . Alcohol use: Yes    Comment: occ  . Drug use: No  . Sexual activity: Not Currently  Other Topics Concern  . Not on file  Social History Narrative  . Not on file   Social Determinants of Health   Financial Resource Strain:   . Difficulty of Paying Living Expenses: Not on file  Food Insecurity:   . Worried About Charity fundraiser in the Last Year: Not on file  . Ran Out of Food in the Last Year: Not on file  Transportation Needs:   . Lack of Transportation (Medical): Not on file  . Lack of Transportation (Non-Medical): Not on file  Physical Activity:   . Days of Exercise per Week: Not on file  . Minutes of Exercise per Session: Not on file  Stress:   . Feeling of Stress : Not on file  Social Connections:   . Frequency of Communication with Friends and Family: Not on file  . Frequency of Social Gatherings with Friends and Family: Not on file  .  Attends Religious Services: Not on file  . Active Member of Clubs or Organizations: Not on file  . Attends Archivist Meetings: Not on file  . Marital Status: Not on file  Intimate Partner Violence:   . Fear of Current or Ex-Partner: Not on file  . Emotionally Abused: Not on file  . Physically Abused: Not on file  . Sexually Abused: Not on file    Outpatient Medications Prior to Visit  Medication Sig Dispense Refill  . albuterol (PROAIR HFA) 108 (90 Base) MCG/ACT inhaler INHALE 2 PUFFS BY MOUTH EVERY 4-6 HRS AS NEEDED 8.5 Inhaler 3  . clonazePAM (KLONOPIN) 0.5 MG tablet TAKE 1 TABLET BY MOUTH TWICE A DAY AS NEEDED 30 tablet 0  . betamethasone dipropionate 0.05 % cream Apply topically 2 (two) times daily. (Patient not taking: Reported on 04/22/2020) 30 g 0    Facility-Administered Medications Prior to Visit  Medication Dose Route Frequency Provider Last Rate Last Admin  . 0.9 %  sodium chloride infusion  500 mL Intravenous Once Armbruster, Carlota Raspberry, MD      . 0.9 %  sodium chloride infusion  500 mL Intravenous Once Armbruster, Carlota Raspberry, MD        No Known Allergies  ROS Review of Systems Per hpi     Objective:    Physical Exam Vitals and nursing note reviewed.  Constitutional:      General: She is not in acute distress.    Appearance: Normal appearance. She is normal weight. She is not ill-appearing, toxic-appearing or diaphoretic.  Cardiovascular:     Rate and Rhythm: Normal rate and regular rhythm.     Heart sounds: Normal heart sounds. No murmur heard.  No friction rub. No gallop.   Pulmonary:     Effort: Pulmonary effort is normal. No respiratory distress.     Breath sounds: Normal breath sounds. No stridor. No wheezing, rhonchi or rales.  Chest:     Chest wall: No tenderness.  Abdominal:     General: Abdomen is flat. Bowel sounds are normal. There is no distension.     Palpations: Abdomen is soft. There is no mass.     Tenderness: There is no abdominal tenderness.  Skin:    General: Skin is warm and dry.  Neurological:     General: No focal deficit present.     Mental Status: She is alert and oriented to person, place, and time. Mental status is at baseline.  Psychiatric:        Mood and Affect: Mood normal.        Behavior: Behavior normal.        Thought Content: Thought content normal.        Judgment: Judgment normal.     BP (!) 146/91   Pulse 70   Temp 98.6 F (37 C) (Temporal)   Resp 17   Ht 5\' 9"  (1.753 m)   Wt 201 lb 9.6 oz (91.4 kg)   LMP 01/11/2016 (Approximate)   SpO2 97%   BMI 29.77 kg/m  Wt Readings from Last 3 Encounters:  05/04/20 203 lb 12.8 oz (92.4 kg)  04/22/20 201 lb 9.6 oz (91.4 kg)  01/28/20 201 lb 4.8 oz (91.3 kg)     Health Maintenance Due  Topic Date Due  . Hepatitis C  Screening  Never done  . COVID-19 Vaccine (1) Never done  . INFLUENZA VACCINE  06/21/2020    There are no preventive care reminders to display for this patient.  Lab Results  Component Value Date   TSH 1.770 04/22/2020   Lab Results  Component Value Date   WBC 6.9 04/22/2020   HGB 13.2 04/22/2020   HCT 40.8 04/22/2020   MCV 85 04/22/2020   PLT 291 04/22/2020   Lab Results  Component Value Date   NA 139 04/22/2020   K 4.3 04/22/2020   CO2 25 04/22/2020   GLUCOSE 75 04/22/2020   BUN 9 04/22/2020   CREATININE 0.75 04/22/2020   BILITOT 0.5 04/22/2020   ALKPHOS 102 04/22/2020   AST 16 04/22/2020   ALT 19 04/22/2020   PROT 7.5 04/22/2020   ALBUMIN 4.3 04/22/2020   CALCIUM 9.7 04/22/2020   ANIONGAP 6 01/26/2016   Lab Results  Component Value Date   CHOL 215 (H) 01/28/2020   Lab Results  Component Value Date   HDL 86 01/28/2020   Lab Results  Component Value Date   LDLCALC 117 (H) 01/28/2020   Lab Results  Component Value Date   TRIG 66 01/28/2020   Lab Results  Component Value Date   CHOLHDL 2.5 01/28/2020   Lab Results  Component Value Date   HGBA1C 5.7 05/21/2018      Assessment & Plan:   Problem List Items Addressed This Visit    None    Visit Diagnoses    Nausea without vomiting    -  Primary   Relevant Orders   TSH (Completed)   CBC (Completed)   Comprehensive metabolic panel (Completed)   Amylase (Completed)   Lipase (Completed)   Poor appetite       Relevant Orders   TSH (Completed)   CBC (Completed)   Comprehensive metabolic panel (Completed)   Amylase (Completed)   Lipase (Completed)   Change in weight       Relevant Orders   TSH (Completed)   CBC (Completed)   Comprehensive metabolic panel (Completed)   Fear of flying       Relevant Medications   clonazePAM (KLONOPIN) 0.5 MG tablet      Meds ordered this encounter  Medications  . clonazePAM (KLONOPIN) 0.5 MG tablet    Sig: Take 1 tablet (0.5 mg total) by mouth 2 (two)  times daily as needed.    Dispense:  30 tablet    Refill:  0    Follow-up: No follow-ups on file.   PLAN  Discussed that pre-peri-and post menopause may have symptoms of change of taste that occurs rapidly  Will draw labs to check for any abnormalities  Refill clonazepam  Patient encouraged to call clinic with any questions, comments, or concerns.  Maximiano Coss, NP

## 2020-08-20 ENCOUNTER — Encounter: Payer: Self-pay | Admitting: Podiatry

## 2020-08-20 ENCOUNTER — Ambulatory Visit (INDEPENDENT_AMBULATORY_CARE_PROVIDER_SITE_OTHER): Payer: BC Managed Care – PPO | Admitting: Podiatry

## 2020-08-20 ENCOUNTER — Other Ambulatory Visit: Payer: Self-pay

## 2020-08-20 DIAGNOSIS — M722 Plantar fascial fibromatosis: Secondary | ICD-10-CM

## 2020-08-20 MED ORDER — METHYLPREDNISOLONE 4 MG PO TBPK: ORAL_TABLET | ORAL | 0 refills | Status: DC

## 2020-08-20 MED ORDER — GABAPENTIN 100 MG PO CAPS: 100.0000 mg | ORAL_CAPSULE | Freq: Every day | ORAL | 3 refills | Status: DC

## 2020-08-20 NOTE — Progress Notes (Signed)
She presents today for chief complaint of pain to her bilateral legs and feet.  States that she is currently been taking a lot of vitamin D prescribed by her primary care provider because her vitamin D level was low hoping that this would help her legs.  She states that her legs feel weak and they are painful particularly at nighttime is hard for her to rest.  She says she feels like she has to move her legs all the time she has previously been tested 20+ years ago for multiple sclerosis the only place that the plaques were seen her in her brain and not on her spine and her spinal tap was negative.  But she still feels that she may have something similar to MS or some type of neurologic problem.  Objective: Vital signs are stable alert oriented x3.  Pulses are palpable.  Neurologic sensorium is intact.  Normal plantar response.  She has pain on palpation medial calcaneal tubercle severe nature bilateral.  Assessment: Cannot rule out a neuropathy of some sort I did recommend she follow-up with neurosurgery since it has been 20 years.  I also went ahead and performed on bilateral heel spur or plantar fascial injections with 20 mg Kenalog 5 mg Marcaine point maximal tenderness.  But her prescription for methylprednisolone to help with the initial plan fasciitis also went ahead and wrote her prescription for gabapentin 100mg  1 p.o. nightly.  I will follow-up with her in 4 to 6 weeks just for reevaluation of the heels and the gabapentin.

## 2020-08-26 ENCOUNTER — Telehealth: Payer: Self-pay | Admitting: Registered Nurse

## 2020-08-26 NOTE — Telephone Encounter (Signed)
Pt called and stated when she had her referral appt that the doctor stated pt should see a neurologist.  Pt is wondering if she would need to come back in or since the Doctor that saw her is with [facility] if she can just go ahead and get the referral to the neurologist. Please advise.

## 2020-08-26 NOTE — Telephone Encounter (Signed)
Pt was last seen in 05/04/20 and would like a referral to Neuro. Pt doesn't have a follow up scheduled.  Reason for Neuro referral: was recommended Rheumatologists.

## 2020-10-01 ENCOUNTER — Ambulatory Visit (INDEPENDENT_AMBULATORY_CARE_PROVIDER_SITE_OTHER): Payer: BC Managed Care – PPO | Admitting: Podiatry

## 2020-10-01 ENCOUNTER — Encounter: Payer: Self-pay | Admitting: Podiatry

## 2020-10-01 ENCOUNTER — Other Ambulatory Visit: Payer: Self-pay

## 2020-10-01 DIAGNOSIS — G5793 Unspecified mononeuropathy of bilateral lower limbs: Secondary | ICD-10-CM

## 2020-10-03 NOTE — Progress Notes (Signed)
She presents today for follow-up of her gabapentin as well as plantar fasciitis.  She states that her primary care provider recommended that she not take the gabapentin.  She states that she had rather send the patient to a neurologist.  Objective: Vital signs are stable she is alert and oriented x3 states that her plantar fasciitis is 80% improved she does have less pain on palpation medial calcaneal tubercles bilaterally.  States that they seem to be improving.  Assessment: Plantar fasciitis bilateral.  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  Plan: Discussed etiology pathology conservative versus surgical therapies.  At this point if her primary care wants her to wait on the gabapentin we will do so but more than likely they will not find small fiber neuropathy via nerve conduction velocity exam.  Most likely we would have to do a punch biopsy for epidermal nerve concentration.  She will continue her current therapies for her plantar fasciitis.  Should this worsen or regress she will notify us immediately.

## 2020-11-25 ENCOUNTER — Ambulatory Visit: Payer: BC Managed Care – PPO | Attending: Family

## 2020-11-25 ENCOUNTER — Other Ambulatory Visit: Payer: Self-pay | Admitting: Registered Nurse

## 2020-11-25 DIAGNOSIS — E559 Vitamin D deficiency, unspecified: Secondary | ICD-10-CM

## 2020-11-25 DIAGNOSIS — Z23 Encounter for immunization: Secondary | ICD-10-CM

## 2020-11-25 NOTE — Telephone Encounter (Signed)
Requested medication (s) are due for refill today: yes  Requested medication (s) are on the active medication list: yes   Last refill: 07/30/20  #8  1 refill  Future visit scheduled no  Notes to clinic: not delegated  Requested Prescriptions  Pending Prescriptions Disp Refills   Vitamin D, Ergocalciferol, (DRISDOL) 1.25 MG (50000 UNIT) CAPS capsule [Pharmacy Med Name: VITAMIN D2 1.25MG (50,000 UNIT)] 8 capsule 1    Sig: TAKE 1 CAPSULE (50,000 UNITS TOTAL) BY MOUTH EVERY 7 (SEVEN) DAYS.      Endocrinology:  Vitamins - Vitamin D Supplementation Failed - 11/25/2020  1:59 AM      Failed - 50,000 IU strengths are not delegated      Failed - Phosphate in normal range and within 360 days    No results found for: PHOS        Failed - Vitamin D in normal range and within 360 days    Vit D, 25-Hydroxy  Date Value Ref Range Status  05/04/2020 9.4 (L) 30.0 - 100.0 ng/mL Final    Comment:    Vitamin D deficiency has been defined by the Institute of Medicine and an Endocrine Society practice guideline as a level of serum 25-OH vitamin D less than 20 ng/mL (1,2). The Endocrine Society went on to further define vitamin D insufficiency as a level between 21 and 29 ng/mL (2). 1. IOM (Institute of Medicine). 2010. Dietary reference    intakes for calcium and D. [place]: The    [employer]. 2. Holick MF, Binkley Hastings, Bischoff-Ferrari HA, et al.    Evaluation, treatment, and prevention of vitamin D    deficiency: an Endocrine Society clinical practice    guideline. JCEM. 2011 Jul; 96(7):1911-30.           Passed - Ca in normal range and within 360 days    Calcium  Date Value Ref Range Status  04/22/2020 9.7 8.7 - 10.2 mg/dL Final   Calcium, Ion  Date Value Ref Range Status  03/07/2016 1.16 1.12 - 1.23 mmol/L Final          Passed - Valid encounter within last 12 months    Recent Outpatient Visits           6 months ago Arthralgia of both knees   [facility], Richard, NP   7 months ago Nausea without vomiting   [facility], Gerlene Burdock, NP   9 months ago Other urticaria   [facility] at [facility], Lonna Cobb, NP   10 months ago Dyslipidemia   [facility] at [facility], Zoe A, MD   11 months ago Rash and nonspecific skin eruption   [facility], Gerlene Burdock, NP

## 2021-01-06 ENCOUNTER — Ambulatory Visit: Payer: BC Managed Care – PPO | Admitting: Neurology

## 2021-01-06 ENCOUNTER — Encounter: Payer: Self-pay | Admitting: Neurology

## 2021-01-06 DIAGNOSIS — G2581 Restless legs syndrome: Secondary | ICD-10-CM | POA: Diagnosis not present

## 2021-01-06 HISTORY — DX: Restless legs syndrome: G25.81

## 2021-01-06 MED ORDER — PRAMIPEXOLE DIHYDROCHLORIDE 0.25 MG PO TABS: 0.2500 mg | ORAL_TABLET | Freq: Every day | ORAL | 1 refills | Status: DC

## 2021-01-06 NOTE — Progress Notes (Signed)
Reason for visit: Bilateral lower extremity discomfort  Referring physician: Dr. Allayne Gitelman is a 55 y.o. female  History of present illness:  Marilyn Jensen is a 55 year old right-handed black female with a history of lower extremity discomfort at night dating back 3 to 4 years.  The patient indicates that she feels fine throughout the day but when she lies down at night she has a fairly rapid onset of discomfort that begins in the upper thighs and goes down to the feet.  The symptoms are generally a little bit worse on the right than the left.  She can sit for prolonged periods of time during the day without difficulty, but lying down will initiate the problem.  She has not tried to get up and walk again when she lies down, but she has to keep moving her legs about to try to get a comfortable position.  Usually, she can get to sleep within about an hour but she is having discomfort until then.  She denies any low back pain.  She denies any numbness of the lower extremities or any problems with control of the bowels or the bladder.  She does have some mild clumsiness, she may stumble on occasion but generally she does not fall.  Between 2006 and 2009, she did have some tingling and numbness sensations in the lower extremities, she underwent a work-up for demyelinating disease with MRI of the brain and thoracic spine.  She was told she had MRI brain lesions, but a lumbar puncture did not confirm the diagnosis of multiple sclerosis.  Currently, she takes ibuprofen if needed.  She has not taken any other medications for her leg pain.  She denies any vision changes, she has occasional dizziness problems.  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  She is sent to this office for further evaluation of her leg discomfort.  Past Medical History:  Diagnosis Date  . Anemia   .  Anxiety   . Asthma   . Blood transfusion without reported diagnosis   . Fibroid   . GERD (gastroesophageal reflux disease)     Past Surgical History:  Procedure Laterality Date  . ABDOMINAL HYSTERECTOMY  02/01/2016   Procedure: HYSTERECTOMY ABDOMINAL, Bilateral Salpingectomy, Lysis of Adhesions;  Surgeon: Crawford Givens, MD;  Location: [facility];  Service: Gynecology;;  Procedure #2  . BREAST REDUCTION SURGERY  2020  . CESAREAN SECTION    . CHOLECYSTECTOMY    . CYSTO  02/01/2016   Procedure: CYSTO;  Surgeon: Crawford Givens, MD;  Location: [facility];  Service: Gynecology;;  . EMBOLIZATION     fibroids  . LAPAROSCOPY  02/01/2016   Procedure: LAPAROSCOPY DIAGNOSTIC;  Surgeon: Crawford Givens, MD;  Location: [facility];  Service: Gynecology;;  Procedure #1  . MYOMECTOMY    . THYROIDECTOMY, PARTIAL    . under arm surgery     fatty tissue removal    History reviewed. No pertinent family history.  Social history:  reports that she has never smoked. She has never used smokeless tobacco. She reports current alcohol use. She reports that she does not use drugs.  Medications:  Prior to Admission medications   Medication Sig Start Date End Date Taking? Authorizing Provider  albuterol (PROAIR HFA) 108 (90 Base) MCG/ACT inhaler INHALE 2 PUFFS BY MOUTH EVERY 4-6 HRS AS NEEDED 04/26/19  Yes Nolon Rod,  Zoe A, MD  clonazePAM (KLONOPIN) 0.5 MG tablet Take 1 tablet (0.5 mg total) by mouth 2 (two) times daily as needed. 04/22/20  Yes Maximiano Coss, NP  ibuprofen (ADVIL) 200 MG tablet Take 200 mg by mouth every 6 (six) hours as needed.   Yes [provider]  Vitamin D, Ergocalciferol, (DRISDOL) 1.25 MG (50000 UNIT) CAPS capsule TAKE 1 CAPSULE (50,000 UNITS TOTAL) BY MOUTH EVERY 7 (SEVEN) DAYS. 11/25/20  Yes Maximiano Coss, NP     No Known Allergies  ROS:  Out of a complete 14 system review of symptoms, the patient complains only of the following symptoms, and all other reviewed systems are negative.  Leg  pain Dizziness  Blood pressure (!) 150/83, pulse 72, height 5\' 9"  (1.753 m), weight 212 lb (96.2 kg), last menstrual period 01/11/2016.  Physical Exam  General: The patient is alert and cooperative at the time of the examination.  Eyes: Pupils are equal, round, and reactive to light. Discs are flat bilaterally.  Neck: The neck is supple, no carotid bruits are noted.  Respiratory: The respiratory examination is clear.  Cardiovascular: The cardiovascular examination reveals a regular rate and rhythm, no obvious murmurs or rubs are noted.  Skin: Extremities are without significant edema.  Neurologic Exam  Mental status: The patient is alert and oriented x 3 at the time of the examination. The patient has apparent normal recent and remote memory, with an apparently normal attention span and concentration ability.  Cranial nerves: Facial symmetry is present. There is good sensation of the face to pinprick and soft touch bilaterally. The strength of the facial muscles and the muscles to head turning and shoulder shrug are normal bilaterally. Speech is well enunciated, no aphasia or dysarthria is noted. Extraocular movements are full. Visual fields are full. The tongue is midline, and the patient has symmetric elevation of the soft palate. No obvious hearing deficits are noted.  Motor: The motor testing reveals 5 over 5 strength of all 4 extremities. Good symmetric motor tone is noted throughout.  Sensory: Sensory testing is intact to pinprick, soft touch, vibration sensation, and position sense on all 4 extremities. No evidence of extinction is noted.  Coordination: Cerebellar testing reveals good finger-nose-finger and heel-to-shin bilaterally.  Gait and station: Gait is normal. Tandem gait is normal. Romberg is negative. No drift is seen.  Reflexes: Deep tendon reflexes are symmetric and normal bilaterally. Toes are downgoing bilaterally.   Assessment/Plan:  1.  Nocturnal leg  discomfort, possible restless leg syndrome  The patient will be given a trial on Mirapex for symptomatic and diagnostic benefit.  If the Mirapex is not effective, the patient will contact our office.  Otherwise, she will follow up in 3 to 4 months.  Jill Alexanders MD 01/06/2021 9:42 AM  [facility] [address] [place], [place] [address]  Phone [phone] Fax [phone]

## 2021-02-02 ENCOUNTER — Other Ambulatory Visit: Payer: Self-pay | Admitting: Neurology

## 2021-02-16 ENCOUNTER — Other Ambulatory Visit: Payer: Self-pay | Admitting: Neurology

## 2021-02-17 MED ORDER — PRAMIPEXOLE DIHYDROCHLORIDE 0.25 MG PO TABS: 0.2500 mg | ORAL_TABLET | Freq: Every day | ORAL | 1 refills | Status: AC

## 2021-02-17 NOTE — Telephone Encounter (Signed)
Okay for 90-day prescription, I will send this in.

## 2021-02-17 NOTE — Addendum Note (Signed)
Addended by: Kathrynn Ducking on: 02/17/2021 07:27 AM   Modules accepted: Orders

## 2021-03-19 NOTE — Progress Notes (Signed)
   Covid-19 Vaccination Clinic  Name:  Marilyn Jensen    MRN: [id number] DOB: Apr 08, 1966  03/19/2021  [name] was observed post Covid-19 immunization for 15 minutes without incident. She was provided with Vaccine Information Sheet and instruction to access the V-Safe system.   [name] was instructed to call 911 with any severe reactions post vaccine: Marland Kitchen Difficulty breathing  . Swelling of face and throat  . A fast heartbeat  . A bad rash all over body  . Dizziness and weakness   Immunizations Administered    Name Date Dose VIS Date Route   Moderna Covid-19 Booster Vaccine 11/25/2020 10:30 AM 0.25 mL 09/09/2020 Intramuscular   Manufacturer: Moderna   Lot: [id number]   [place]: [id number]

## 2021-05-13 ENCOUNTER — Encounter: Payer: Self-pay | Admitting: Neurology

## 2021-05-13 ENCOUNTER — Telehealth: Payer: Self-pay | Admitting: Neurology

## 2021-05-13 ENCOUNTER — Ambulatory Visit: Payer: BC Managed Care – PPO | Admitting: Neurology

## 2021-05-13 NOTE — Telephone Encounter (Signed)
This patient did not show for a revisit appointment today. 

## 2021-05-27 ENCOUNTER — Other Ambulatory Visit: Payer: Self-pay

## 2021-05-27 ENCOUNTER — Ambulatory Visit: Payer: BC Managed Care – PPO | Admitting: Podiatry

## 2021-05-27 DIAGNOSIS — M722 Plantar fascial fibromatosis: Secondary | ICD-10-CM

## 2021-05-27 MED ORDER — TRIAMCINOLONE ACETONIDE 40 MG/ML IJ SUSP
40.0000 mg | Freq: Once | INTRAMUSCULAR | Status: AC
  Administered 2021-05-27: 40 mg

## 2021-05-28 ENCOUNTER — Other Ambulatory Visit: Payer: Self-pay | Admitting: Registered Nurse

## 2021-05-28 DIAGNOSIS — E559 Vitamin D deficiency, unspecified: Secondary | ICD-10-CM

## 2021-05-28 NOTE — Progress Notes (Signed)
She presents today for follow-up of her bilateral foot pain she has not been here since November of last year states that she would like to have injections if possible she states that the pain is not constant she does get some swelling which varies in its intensity she states that she still wears her boot at nighttime and her brace if necessary states that when the pain hits it is about a 7 out of 10.  Objective: Vital signs are stable she is alert and oriented x3.  Pulses are palpable.  She still has moderate pain on palpation medial calcaneal tubercles bilaterally.  There is no pain plantar central calcaneal tubercle no pain posteriorly no pain laterally.  Assessment: Planter fasciitis bilateral.  Plan: I injected the bilateral heels today 20 mg Kenalog 5 mg Marcaine point of maximal tenderness.  Tolerated procedure well without complications once again discussed appropriate shoe gear stretching size ice therapy shoe gear modifications and shoe gear life expectancy and I will follow-up with her in a few months or as necessary.

## 2021-07-18 IMAGING — DX DG LUMBAR SPINE COMPLETE 4+V
5 series · 5 of 5 positions shown · non-contrast
Comparison: None.

CLINICAL DATA: Low back pain.

EXAM:
LUMBAR SPINE - COMPLETE 4+ VIEW

[l-spine ap]
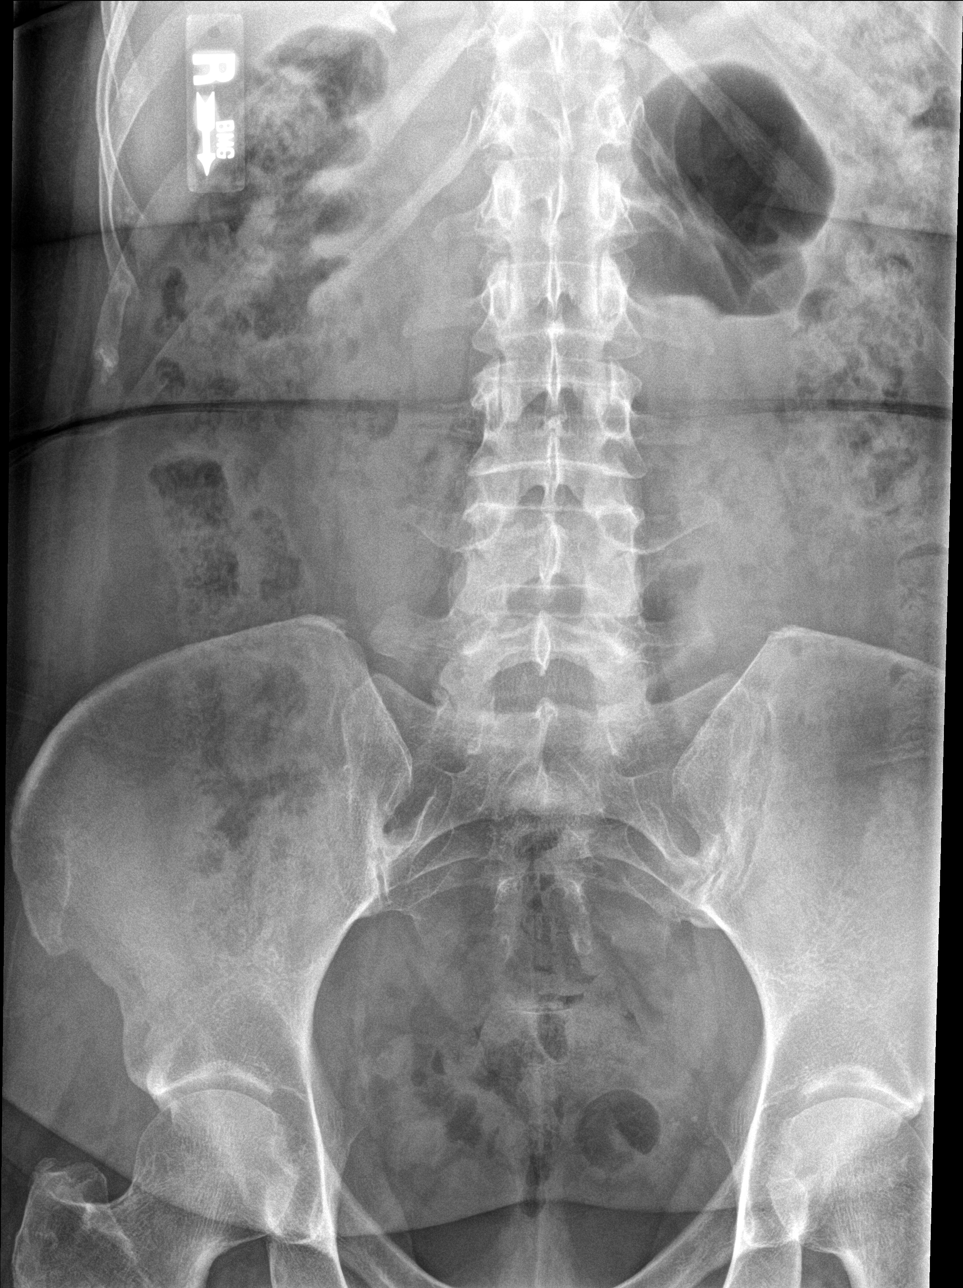

[l-spine obl (1 of 2)]
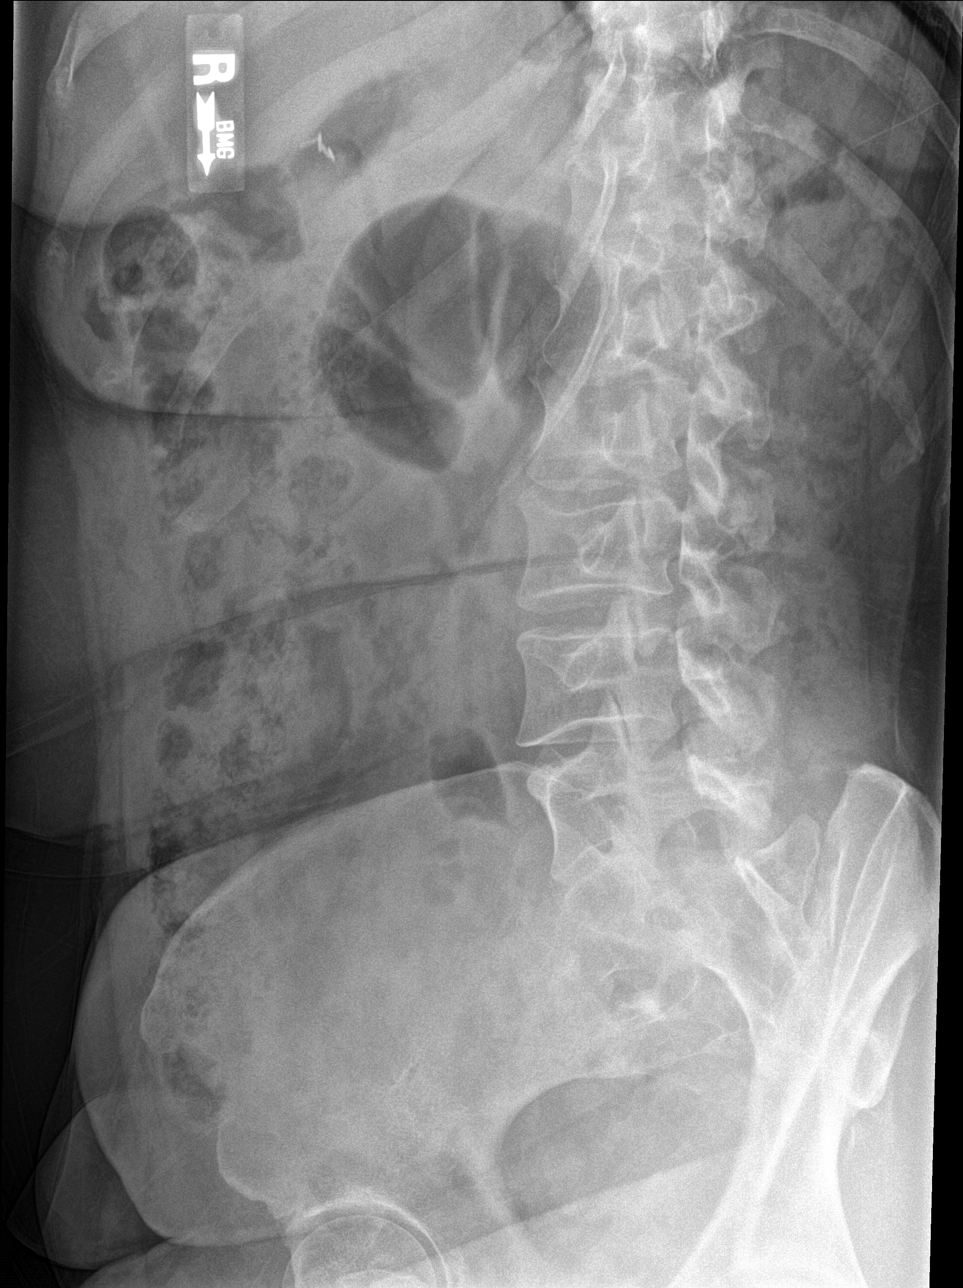

[l-spine obl (2 of 2)]
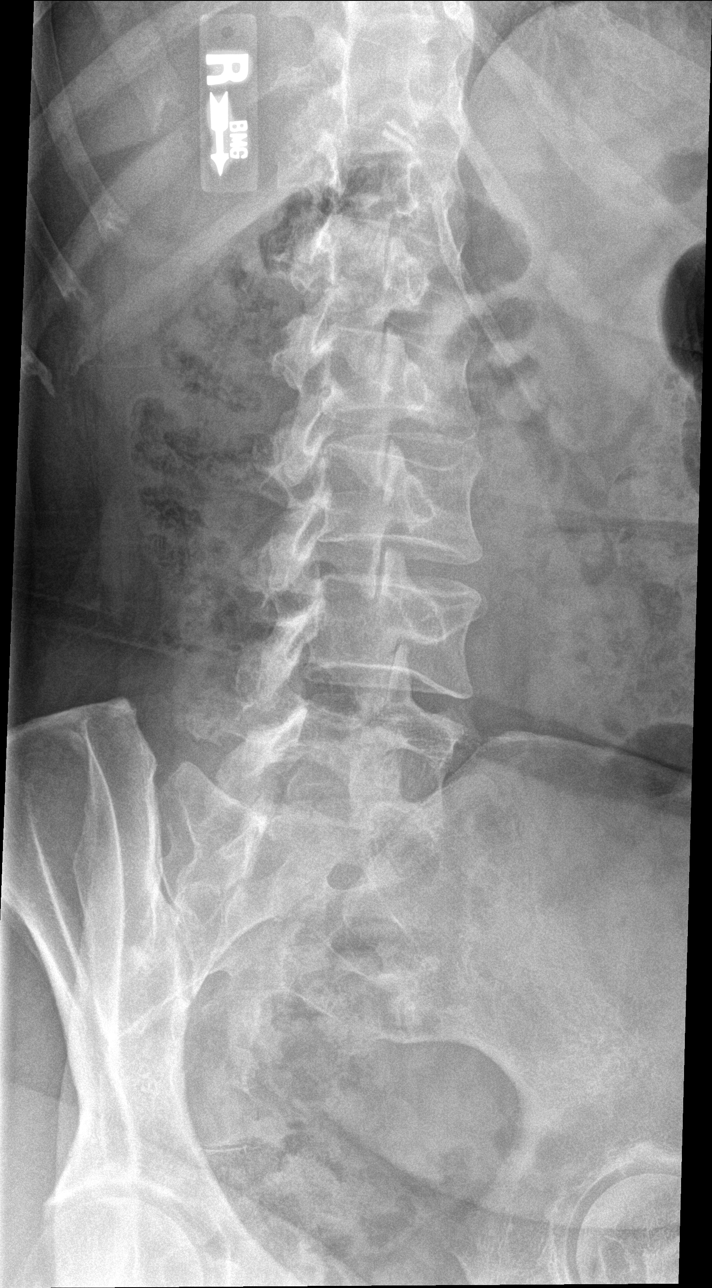

[l-spine lat]
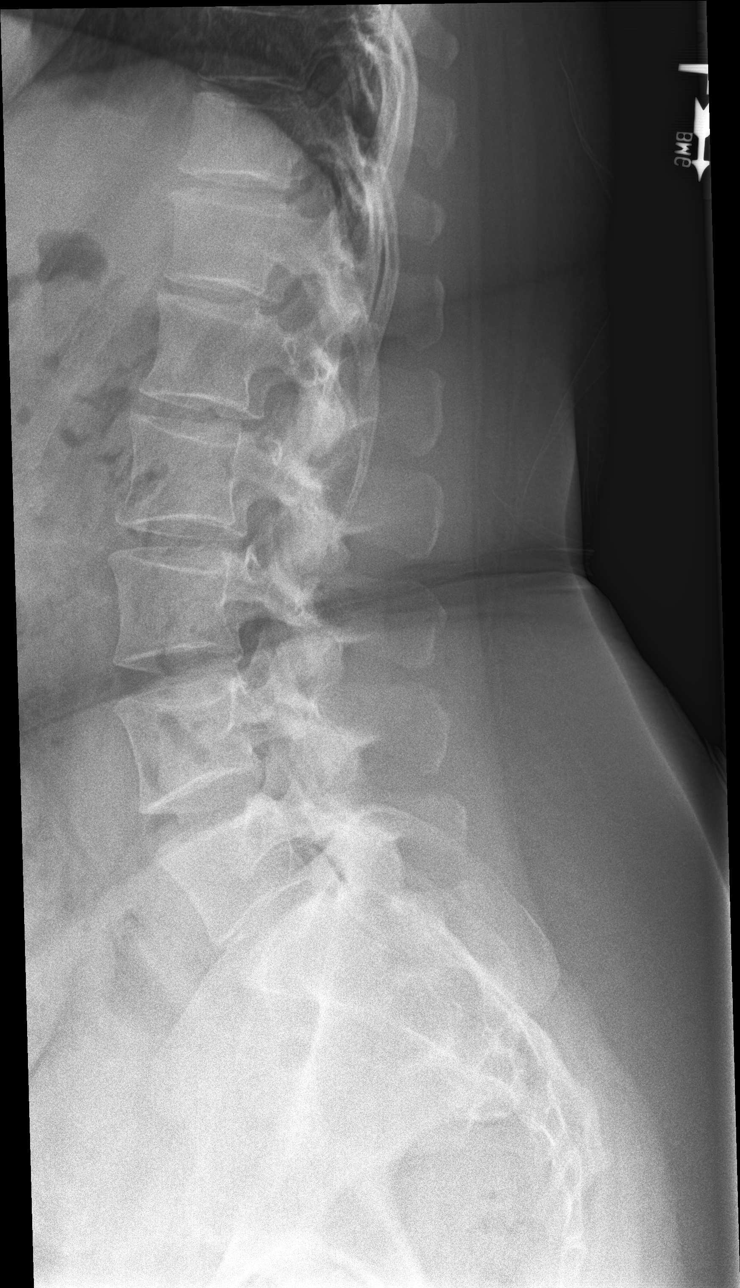

[l-spine l5-s1]
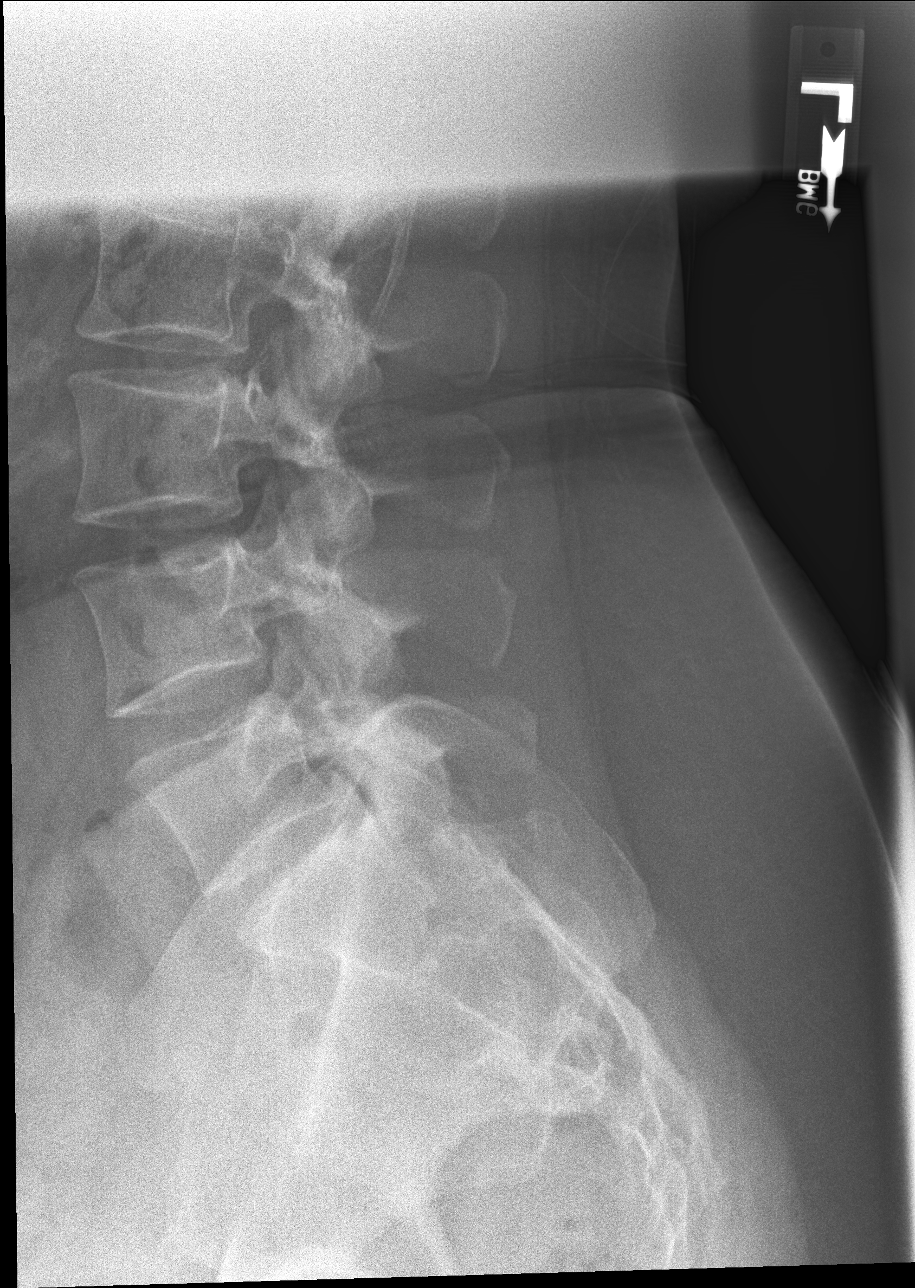

[5 of 5 positions shown; findings below may reference images not displayed]

FINDINGS: There is no evidence of lumbar spine fracture. Alignment is normal.
Intervertebral disc spaces are maintained.
IMPRESSION: Negative.

## 2022-05-16 ENCOUNTER — Encounter (HOSPITAL_BASED_OUTPATIENT_CLINIC_OR_DEPARTMENT_OTHER): Payer: Self-pay | Admitting: Emergency Medicine

## 2022-05-16 ENCOUNTER — Other Ambulatory Visit: Payer: Self-pay

## 2022-05-16 ENCOUNTER — Emergency Department (HOSPITAL_BASED_OUTPATIENT_CLINIC_OR_DEPARTMENT_OTHER)
Admission: EM | Admit: 2022-05-16 | Discharge: 2022-05-16 | Disposition: A | Discharge status: Home / Self Care | Payer: BC Managed Care – PPO | Attending: Emergency Medicine | Admitting: Emergency Medicine

## 2022-05-16 DIAGNOSIS — Z7951 Long term (current) use of inhaled steroids: Secondary | ICD-10-CM | POA: Diagnosis not present

## 2022-05-16 DIAGNOSIS — L089 Local infection of the skin and subcutaneous tissue, unspecified: Secondary | ICD-10-CM

## 2022-05-16 DIAGNOSIS — R2231 Localized swelling, mass and lump, right upper limb: Secondary | ICD-10-CM | POA: Diagnosis present

## 2022-05-16 DIAGNOSIS — L03011 Cellulitis of right finger: Secondary | ICD-10-CM | POA: Insufficient documentation

## 2022-05-16 DIAGNOSIS — J45909 Unspecified asthma, uncomplicated: Secondary | ICD-10-CM | POA: Insufficient documentation

## 2022-05-16 NOTE — ED Triage Notes (Signed)
Pt arrives to ED with c/o finger infection. Pt reports x4 days ago she noticed tenderness and swelling to her right middle finger. She went to UC today and was dx with pulp-space infection and prescribed Doxycycline. UC attempted I&D but failed to do so.

## 2022-08-08 ENCOUNTER — Ambulatory Visit: Payer: BC Managed Care – PPO | Admitting: Podiatry

## 2022-08-08 ENCOUNTER — Encounter: Payer: Self-pay | Admitting: Podiatry

## 2022-08-08 DIAGNOSIS — M722 Plantar fascial fibromatosis: Secondary | ICD-10-CM

## 2022-08-08 MED ORDER — TRIAMCINOLONE ACETONIDE 40 MG/ML IJ SUSP
40.0000 mg | Freq: Once | INTRAMUSCULAR | Status: AC
  Administered 2022-08-08: 40 mg

## 2022-08-08 NOTE — Progress Notes (Signed)
She presents today states that this time for recheck and injections this been about a year since have been here.  States that she has noticed that the heels are starting to become more painful states that they and are nowhere near where they used to be when she first started with this.  Objective: Vital signs are stable she is alert and oriented x3.  Pulses are palpable.  She has pain on palpation medial calcaneal tubercles but minimally so.  Assessment: Planter fasciitis bilateral heels.  Plan: Injected the bilateral heels today 20 mg Kenalog 5 mg Marcaine point maximal tenderness.  Tolerated procedure well without complications we will follow-up with me on an as-needed basis.

## 2022-08-16 ENCOUNTER — Ambulatory Visit: Payer: BC Managed Care – PPO | Admitting: Podiatry

## 2023-10-26 ENCOUNTER — Ambulatory Visit: Payer: BC Managed Care – PPO | Admitting: Podiatry

## 2023-10-31 ENCOUNTER — Encounter: Payer: Self-pay | Admitting: Podiatry

## 2023-10-31 ENCOUNTER — Ambulatory Visit: Payer: BC Managed Care – PPO | Admitting: Podiatry

## 2023-10-31 DIAGNOSIS — M722 Plantar fascial fibromatosis: Secondary | ICD-10-CM

## 2023-10-31 MED ORDER — TRIAMCINOLONE ACETONIDE 40 MG/ML IJ SUSP
40.0000 mg | Freq: Once | INTRAMUSCULAR | Status: AC
  Administered 2023-10-31: 40 mg

## 2023-10-31 NOTE — Progress Notes (Signed)
She presents today stating well I guess this time for another set of shots has been about 9 months or so.  She has my heels of started hurting.  Objective: Vital signs stable alert and oriented x 3.  Pulses are palpable.  There is no erythema edema cellulitis drainage or odor she has pain to palpation medial calcaneal tubercle bilateral heel.  Assessment: Pain limb secondary to plantar fasciitis.  Plan: Reinjected bilateral heels today 20 mg Kenalog 5 m Marcaine point of maximal tenderness.

## 2025-06-09 ENCOUNTER — Ambulatory Visit: Admitting: Family Medicine
